# Patient Record
Sex: Male | Born: 1975 | Race: Black or African American | Hispanic: No | Marital: Single | Smoking: Current every day smoker
Health system: Southern US, Community
[De-identification: ages and names within clinical notes are randomized; demographics above are authoritative.]

## PROBLEM LIST (undated history)

## (undated) DIAGNOSIS — E669 Obesity, unspecified: Secondary | ICD-10-CM

## (undated) DIAGNOSIS — I10 Essential (primary) hypertension: Secondary | ICD-10-CM

## (undated) DIAGNOSIS — I639 Cerebral infarction, unspecified: Secondary | ICD-10-CM

## (undated) HISTORY — PX: CHOLECYSTECTOMY: SHX55

## (undated) HISTORY — PX: APPENDECTOMY: SHX54

---

## 2003-03-17 ENCOUNTER — Emergency Department (HOSPITAL_COMMUNITY): Admission: EM | Admit: 2003-03-17 | Discharge: 2003-03-17 | Payer: Self-pay | Admitting: Emergency Medicine

## 2004-04-09 ENCOUNTER — Emergency Department (HOSPITAL_COMMUNITY): Admission: EM | Admit: 2004-04-09 | Discharge: 2004-04-09 | Payer: Self-pay | Admitting: Emergency Medicine

## 2004-04-09 ENCOUNTER — Ambulatory Visit (HOSPITAL_COMMUNITY): Admission: RE | Admit: 2004-04-09 | Discharge: 2004-04-09 | Payer: Self-pay | Admitting: Neurology

## 2004-05-04 ENCOUNTER — Ambulatory Visit (HOSPITAL_COMMUNITY): Admission: RE | Admit: 2004-05-04 | Discharge: 2004-05-04 | Payer: Self-pay | Admitting: Neurology

## 2005-05-18 ENCOUNTER — Encounter (INDEPENDENT_AMBULATORY_CARE_PROVIDER_SITE_OTHER): Payer: Self-pay | Admitting: Specialist

## 2005-05-18 ENCOUNTER — Inpatient Hospital Stay (HOSPITAL_COMMUNITY): Admission: EM | Admit: 2005-05-18 | Discharge: 2005-05-19 | Payer: Self-pay | Admitting: Emergency Medicine

## 2010-02-26 ENCOUNTER — Emergency Department (HOSPITAL_COMMUNITY): Admission: EM | Admit: 2010-02-26 | Discharge: 2010-02-26 | Payer: Self-pay | Admitting: Emergency Medicine

## 2010-03-02 ENCOUNTER — Emergency Department (HOSPITAL_COMMUNITY): Admission: EM | Admit: 2010-03-02 | Discharge: 2010-03-02 | Payer: Self-pay | Admitting: Emergency Medicine

## 2010-03-02 ENCOUNTER — Encounter (INDEPENDENT_AMBULATORY_CARE_PROVIDER_SITE_OTHER): Payer: Self-pay | Admitting: Internal Medicine

## 2010-03-02 DIAGNOSIS — I10 Essential (primary) hypertension: Secondary | ICD-10-CM | POA: Insufficient documentation

## 2010-03-02 DIAGNOSIS — K8020 Calculus of gallbladder without cholecystitis without obstruction: Secondary | ICD-10-CM | POA: Insufficient documentation

## 2010-03-02 DIAGNOSIS — Z8673 Personal history of transient ischemic attack (TIA), and cerebral infarction without residual deficits: Secondary | ICD-10-CM | POA: Insufficient documentation

## 2010-03-05 ENCOUNTER — Ambulatory Visit: Payer: Self-pay | Admitting: Infectious Diseases

## 2010-03-05 ENCOUNTER — Encounter: Payer: Self-pay | Admitting: Internal Medicine

## 2010-03-05 ENCOUNTER — Inpatient Hospital Stay (HOSPITAL_COMMUNITY): Admission: EM | Admit: 2010-03-05 | Discharge: 2010-03-07 | Payer: Self-pay | Admitting: Emergency Medicine

## 2010-03-06 ENCOUNTER — Encounter: Payer: Self-pay | Admitting: Internal Medicine

## 2010-03-07 ENCOUNTER — Encounter: Payer: Self-pay | Admitting: Internal Medicine

## 2010-12-24 ENCOUNTER — Encounter: Payer: Self-pay | Admitting: Neurology

## 2011-01-02 NOTE — Miscellaneous (Signed)
Summary: hospital admission (HTN urgency)   INTERNAL MEDICINE ADMISSION HISTORY AND PHYSICAL  PCP: None  ATTENDING: Dr. Tilford Pillar 1st contact: Dr. Scot Dock: [phone] 2nd contact: Dr. Comer Locket: [phone] Nights and weekends: [phone], [phone]  CC: chest pain  HPI:  35 y/o man with PMh of HTN and ischemic stroke in 2005 comes to the Ed complainig of abdominal pain. The pain is diffuse, more on the RUQ, sharp in quality, not radiating to back or anywhere. Started today morning. Aggravated by food, not releived by anything. He had similar kind of pain before as well when he came to the ED on 03/02/2010 at which time abdominal USG showed gallstones. He did not have any evidence of cholecystits with outpatient follow up with the genereal surgeon. He had been having episodic abd pain similar to what he has today releived with percocet.  He denies fever, nausea, vomiting, chest pain or SOB.  He has not seen Gen Surgeon yet as he did not have any primary insurance.   ALLERGIES: NKDA  PAST MEDICAL HISTORY: Essential HTN.  Ischemic stroke in 2005. No residual deficits.  Gallstones seen on USG (02/27/2010) when he was in the ED for abd pain- No evidence of cholecystits. scheduled for outaptient visit with gen surgeon  MEDICATIONS:  LISINOPRIL-HYDROCHLOROTHIAZIDE 10-12.5 MG TABS (LISINOPRIL-HYDROCHLOROTHIAZIDE) take 1 tablet daily ASPIR-LOW 81 MG TBEC (ASPIRIN) take 1 tablet daily PERCOCET 5-325 MG TABS (OXYCODONE-ACETAMINOPHEN) 1-2 tabs every 6 hours as needed for pain    SOCIAL HISTORY: Lives with family . Used to work in [employer], has been [employer] since last few months. smoking, denies Alcahol and illicit drug use.   FAMILY HISTORY No significant family history of CAD, malignancy or any other medical probs in family he is aware of.   VITALS: T: 97.9  P: 97  BP: 244/144  R:17/min  O2SAT: 99%  ON: RA  PHYSICAL EXAM: Gen: awake, NAd HEENT: PERRLA, EOMI, mucous memb moist  and pink CVS: s1 s2 normal, no murmurs Respi: CTA b/l, no wheezes, crackles.  Abd: soft, ttp on RUQ and LUQ. also gaurding. No rigidity,No CVA tender. Normal  bs b/l.  Neuro: Oriented X3, cranial 2-12 intact, normal sensation, motor strength, reflexes 2+b/l Extremity: no edema, no calf tenderness.  LABS:   Sodium (NA)                              138               135-145          mEq/L  Potassium (K)                            4.6               3.5-5.1          mEq/L  Chloride                                 107               96-112           mEq/L  Glucose                                  136  h      70-99            mg/dL  BUN                                      12                6-23             mg/dL  Creatinine                               1.1               0.4-1.5          mg/dL  Bilirubin, Total                         0.8               0.3-1.2          mg/dL  Bilirubin, Direct                        0.3               0.0-0.3          mg/dL  Indirect Bilirubin                       0.5               0.3-0.9          mg/dL  Alkaline Phosphatase                     51                39-117           U/L  SGOT (AST)                               33                0-37             U/L  SGPT (ALT)                               33                0-53             U/L  Total  Protein                           7.6               6.0-8.3          g/dL  Albumin-Blood                            4.0               3.5-5.2          g/dL   WBC  5.5               4.0-10.5         K/uL  RBC                                      4.89              4.22-5.81        MIL/uL  Hemoglobin (HGB)                         15.3              13.0-17.0        g/dL  Hematocrit (HCT)                         44.8              39.0-52.0        %  MCV                                      91.6              78.0-100.0       fL  MCHC                                     34.1               30.0-36.0        g/dL  RDW                                      13.1              11.5-15.5        %  Platelet Count (PLT)                     200               150-400          K/uL  Neutrophils, %                           55                43-77            %  Lymphocytes, %                           32                12-46            %  Monocytes, %                             8                 3-12             %  Eosinophils, %                           5                 0-5              %  Basophils, %                             1                 0-1              %  Neutrophils, Absolute                    3.0               1.7-7.7          K/uL  Lymphocytes, Absolute                    1.7               0.7-4.0          K/uL  Monocytes, Absolute                      0.4               0.1-1.0          K/uL  Eosinophils, Absolute                    0.2               0.0-0.7          K/uL  Basophils, Absolute                      0.0               0.0-0.1          K/uL   Lipase-19   UA:  Color, Urine                             YELLOW            YELLOW  Appearance                               CLEAR             CLEAR  Specific Gravity                         1.016             1.005-1.030  pH                                       7.0               5.0-8.0  Urine Glucose                            NEGATIVE  NEG              mg/dL  Bilirubin                                NEGATIVE          NEG  Ketones                                  NEGATIVE          NEG              mg/dL  Blood                                    NEGATIVE          NEG  Protein                                  NEGATIVE          NEG              mg/dL  Urobilinogen                             0.2               0.0-1.0          mg/dL  Nitrite                                  NEGATIVE          NEG  Leukocytes                               NEGATIVE          NEG    MICROSCOPIC NOT DONE ON URINES WITH  NEGATIVE PROTEIN, BLOOD, LEUKOCYTES,    NITRITE, OR GLUCOSE <1000 mg/dL.  USG (3/31)   1.  Slight decrease and gallbladder wall thickness, now measuring   4.0 mm.  There is still appears be some gallbladder wall edema.   2.  Stable cholelithiasis.  Although there is no sonographic   Murphy's sign, cholecystitis is not excluded.   3.  Probable diffuse fatty infiltration of the liver.  ED Treatmenht:  - ASA 325 poX1 - Dialudid 1mg  iv X2 - zofran 4mg  iv X1 - esmolol 50 micrograms/min  ASSESSMENT AND PLAN:  1. HTN urgency- no signs of EOD. No CP,. SOB, renal insufficiency or AMS. Will check cycle cardiac enz and check 2D echo to assess for cardiac function. When he was in the ED on 03/02/2010, he had simialr blood preassure which was controlled once he was pain free with morphine. He was discharged on lisinopirl and HCTZ. This might be the case as well but given he had a stroke at the age of 85 is also concerning. Will check for secondary cause of hypertension like thyroid, UDS. Will check FLP, HbA1C. Will start labetolol drip and keep on HCTZ-lisinopril. Will not drop BP <180 to maintain perfusion. will control pain with morphine.  2. Abd pain- suspect  2/2 gallstones althought the pain is much more than would expect from stable gallstone. Will get contrast CT abd/pelvis. Will consult surgery tomorrow.  3. CVA- stable. No change  4. VTE PROPH: lovenox

## 2011-01-02 NOTE — Miscellaneous (Signed)
Summary: ED d/c  Hospital Discharge  Date of admission: Not admitted, seen in ED 03/02/10  Date of discharge:  Brief reason for admission/active problems: Abd pain 2/2 gallstones, no fevers, pain resolved. Exam benign. Seen by surgeon on 3/27 who would like to see him as an outpt.   Followup needed: He will see Dr. Threasa Beards on April 7 @ 3:30 pm. He will need follow-up of his HTN. He was started on Lisinopril-HCTZ combo pill on 3/27. He will need eval of his BP and a Bmet at his follow-up appt. It is crucial that his blood pressure be better controlled as he already has a h/o CVA. Please also ensure that the patient has called to schedule his appt with the surgeon to possibly have his gallbladder removed.   The medication and problem lists have been updated.  Please see the dictated discharge summary for details.   Clinical Lists Changes  Problems: Added new problem of CHOLELITHIASIS (ICD-574.20) Added new problem of CVA (ICD-434.91) Added new problem of ESSENTIAL HYPERTENSION (ICD-401.9) Medications: Added new medication of LISINOPRIL-HYDROCHLOROTHIAZIDE 10-12.5 MG TABS (LISINOPRIL-HYDROCHLOROTHIAZIDE) take 1 tablet daily Added new medication of ASPIR-LOW 81 MG TBEC (ASPIRIN) take 1 tablet daily Added new medication of PERCOCET 5-325 MG TABS (OXYCODONE-ACETAMINOPHEN) 1-2 tabs every 6 hours as needed for pain  Appended Document: ED d/c    Clinical Lists Changes  Observations: Added new observation of INSTRUCTIONS: You have an appointment at the [facility] on April 7 @ 3:30 pm. The clinic is located in the [employer] basement of [facility]. Please schedule an appointment with the surgeon as soon as possible.  If you have any questions or concerns, please call the clinic at [phone].   (03/02/2010 16:25)       Complete Medication List: 1)  Lisinopril-hydrochlorothiazide 10-12.5 Mg Tabs (Lisinopril-hydrochlorothiazide) .... Take 1 tablet daily 2)   Aspir-low 81 Mg Tbec (Aspirin) .... Take 1 tablet daily 3)  Percocet 5-325 Mg Tabs (Oxycodone-acetaminophen) .Marland Kitchen.. 1-2 tabs every 6 hours as needed for pain    Patient Instructions: 1)  You have an appointment at the [facility] on April 7 @ 3:30 pm. The clinic is located in the [employer] basement of [facility]. 2)  Please schedule an appointment with the surgeon as soon as possible.  3)  If you have any questions or concerns, please call the clinic at [phone].

## 2011-01-02 NOTE — Miscellaneous (Signed)
Summary: hospital dicharge (acute cholecystitis)  Hospital Discharge  Date of admission: 03/05/2010  Date of discharge: 03/07/2010  Brief reason for admission/active problems: acute cholecystitis,  HTN urgency  Followup needed: - BP check and adjust meds as necessary - added pravastatin in the hospital- patient not on statin after the stroke - post lap cholecystectomy follow up.    The medication and problem lists have been updated.  Please see the dictated discharge summary for details.   Prescriptions: PRAVASTATIN SODIUM 20 MG TABS (PRAVASTATIN SODIUM) Take 1 tablet by mouth once a day  #30 x 1   Entered and Authorized by:   Bethel Born MD   Signed by:   Bethel Born MD on 03/07/2010   Method used:   Print then Give to Patient   RxID:   [id number]    Patient Instructions: 1)  Your next appointment with [facility] is on April 21st at 1:45 pm 2)  Your appointment with surgeon is on 3)  Continue with incesntive spirometry at home 4)  Call the clinic or come to the ED if you develop fever or belly pain

## 2011-02-21 LAB — POCT CARDIAC MARKERS: Troponin i, poc: <0.05 ng/mL (ref 0.00–0.09)

## 2011-02-21 LAB — URINALYSIS, ROUTINE W REFLEX MICROSCOPIC
Bilirubin Urine: NEGATIVE
Glucose, UA: NEGATIVE mg/dL
Hgb urine dipstick: NEGATIVE
Ketones, ur: NEGATIVE mg/dL
Nitrite: NEGATIVE
Specific Gravity, Urine: 1.016 (ref 1.005–1.030)
Urobilinogen, UA: 0.2 mg/dL (ref 0.0–1.0)

## 2011-02-21 LAB — BASIC METABOLIC PANEL
BUN: 9 mg/dL (ref 6–23)
Chloride: 102 mEq/L (ref 96–112)
GFR calc Af Amer: >60 mL/min (ref >60)
Potassium: 3.6 mEq/L (ref 3.5–5.1)

## 2011-02-21 LAB — LIPID PANEL
HDL: 30 mg/dL — ABNORMAL LOW (ref >39)
LDL Cholesterol: 149 mg/dL — ABNORMAL HIGH (ref 0–99)
Total CHOL/HDL Ratio: 6.5 RATIO
VLDL: 16 mg/dL (ref 0–40)

## 2011-02-21 LAB — CBC
HCT: 37.8 % — ABNORMAL LOW (ref 39.0–52.0)
HCT: 44.8 % (ref 39.0–52.0)
Hemoglobin: 13.8 g/dL (ref 13.0–17.0)
Hemoglobin: 15.3 g/dL (ref 13.0–17.0)
MCHC: 34 g/dL (ref 30.0–36.0)
MCHC: 34.1 g/dL (ref 30.0–36.0)
MCV: 91.4 fL (ref 78.0–100.0)
MCV: 91.6 fL (ref 78.0–100.0)
Platelets: 175 10*3/uL (ref 150–400)
RBC: 4.13 MIL/uL — ABNORMAL LOW (ref 4.22–5.81)
RBC: 4.89 MIL/uL (ref 4.22–5.81)
RDW: 13.5 % (ref 11.5–15.5)
WBC: 10.7 10*3/uL — ABNORMAL HIGH (ref 4.0–10.5)
WBC: 5.5 10*3/uL (ref 4.0–10.5)

## 2011-02-21 LAB — CARDIAC PANEL(CRET KIN+CKTOT+MB+TROPI)
CK, MB: 2 ng/mL (ref 0.3–4.0)
Total CK: 199 U/L (ref 7–232)
Troponin I: 0.02 ng/mL (ref 0.00–0.06)

## 2011-02-21 LAB — HEPATIC FUNCTION PANEL
ALT: 33 U/L (ref 0–53)
AST: 33 U/L (ref 0–37)
Total Protein: 7.6 g/dL (ref 6.0–8.3)

## 2011-02-21 LAB — DIFFERENTIAL
Eosinophils Absolute: 0.2 10*3/uL (ref 0.0–0.7)
Eosinophils Relative: 5 % (ref 0–5)
Lymphocytes Relative: 32 % (ref 12–46)
Lymphs Abs: 1.7 10*3/uL (ref 0.7–4.0)
Monocytes Relative: 8 % (ref 3–12)

## 2011-02-21 LAB — GLUCOSE, CAPILLARY
Glucose-Capillary: 117 mg/dL — ABNORMAL HIGH (ref 70–99)
Glucose-Capillary: 162 mg/dL — ABNORMAL HIGH (ref 70–99)

## 2011-02-21 LAB — HIV ANTIBODY (ROUTINE TESTING W REFLEX): HIV: NONREACTIVE

## 2011-02-21 LAB — COMPREHENSIVE METABOLIC PANEL
ALT: 28 U/L (ref 0–53)
Calcium: 8.6 mg/dL (ref 8.4–10.5)
GFR calc Af Amer: >60 mL/min (ref >60)
Glucose, Bld: 117 mg/dL — ABNORMAL HIGH (ref 70–99)
Sodium: 135 mEq/L (ref 135–145)
Total Protein: 6.7 g/dL (ref 6.0–8.3)

## 2011-02-21 LAB — POCT I-STAT, CHEM 8
BUN: 12 mg/dL (ref 6–23)
Creatinine, Ser: 1.1 mg/dL (ref 0.4–1.5)
Glucose, Bld: 136 mg/dL — ABNORMAL HIGH (ref 70–99)
HCT: 47 % (ref 39.0–52.0)
TCO2: 27 mmol/L (ref 0–100)

## 2011-02-21 LAB — AMYLASE: Amylase: 69 U/L (ref 0–105)

## 2011-02-21 LAB — PROTIME-INR
INR: 1.02 (ref 0.00–1.49)
Prothrombin Time: 13.3 seconds (ref 11.6–15.2)

## 2011-02-21 LAB — APTT: aPTT: 32 seconds (ref 24–37)

## 2011-02-21 LAB — TSH: TSH: 0.589 u[IU]/mL (ref 0.350–4.500)

## 2011-02-21 LAB — MRSA PCR SCREENING: MRSA by PCR: NEGATIVE

## 2011-02-25 LAB — URINALYSIS, ROUTINE W REFLEX MICROSCOPIC
Glucose, UA: NEGATIVE mg/dL
Hgb urine dipstick: NEGATIVE
Specific Gravity, Urine: 1.009 (ref 1.005–1.030)
Urobilinogen, UA: 0.2 mg/dL (ref 0.0–1.0)

## 2011-02-25 LAB — DIFFERENTIAL
Basophils Absolute: 0 10*3/uL (ref 0.0–0.1)
Eosinophils Absolute: 0.3 10*3/uL (ref 0.0–0.7)
Eosinophils Relative: 6 % — ABNORMAL HIGH (ref 0–5)
Lymphocytes Relative: 27 % (ref 12–46)
Lymphocytes Relative: 30 % (ref 12–46)
Lymphs Abs: 1.5 10*3/uL (ref 0.7–4.0)
Monocytes Absolute: 0.4 10*3/uL (ref 0.1–1.0)
Monocytes Absolute: 0.5 10*3/uL (ref 0.1–1.0)
Monocytes Relative: 8 % (ref 3–12)
Monocytes Relative: 9 % (ref 3–12)
Neutro Abs: 4.1 10*3/uL (ref 1.7–7.7)

## 2011-02-25 LAB — POCT I-STAT, CHEM 8
Calcium, Ion: 1.23 mmol/L (ref 1.12–1.32)
Creatinine, Ser: 1.2 mg/dL (ref 0.4–1.5)
Glucose, Bld: 111 mg/dL — ABNORMAL HIGH (ref 70–99)
HCT: 49 % (ref 39.0–52.0)
Hemoglobin: 16.7 g/dL (ref 13.0–17.0)

## 2011-02-25 LAB — CBC
HCT: 46 % (ref 39.0–52.0)
HCT: 46.2 % (ref 39.0–52.0)
Hemoglobin: 16 g/dL (ref 13.0–17.0)
MCHC: 33.7 g/dL (ref 30.0–36.0)
MCV: 90.8 fL (ref 78.0–100.0)
MCV: 92.6 fL (ref 78.0–100.0)
Platelets: 188 10*3/uL (ref 150–400)
Platelets: 190 10*3/uL (ref 150–400)
RBC: 5.09 MIL/uL (ref 4.22–5.81)
RDW: 13.3 % (ref 11.5–15.5)
WBC: 4.9 10*3/uL (ref 4.0–10.5)

## 2011-02-25 LAB — POCT CARDIAC MARKERS: Myoglobin, poc: 109 ng/mL (ref 12–200)

## 2011-02-25 LAB — HEPATIC FUNCTION PANEL
ALT: 32 U/L (ref 0–53)
Alkaline Phosphatase: 56 U/L (ref 39–117)
Bilirubin, Direct: <0.1 mg/dL (ref 0.0–0.3)
Total Bilirubin: 0.4 mg/dL (ref 0.3–1.2)

## 2011-02-25 LAB — COMPREHENSIVE METABOLIC PANEL
Albumin: 4.1 g/dL (ref 3.5–5.2)
BUN: 10 mg/dL (ref 6–23)
Creatinine, Ser: 0.96 mg/dL (ref 0.4–1.5)
Total Protein: 7.8 g/dL (ref 6.0–8.3)

## 2011-02-25 LAB — LIPASE, BLOOD: Lipase: 19 U/L (ref 11–59)

## 2011-04-20 NOTE — H&P (Signed)
NAMEPHINEAS, [name]                ACCOUNT NO.:  [id number]   MEDICAL RECORD NO.:  [id number]          PATIENT TYPE:  INP   LOCATION:  5705                         FACILITY:  [facility]   PHYSICIAN:  Gabrielle Dare. Janee Morn, M.D.DATE OF BIRTH:  1976-01-12   DATE OF ADMISSION:  05/17/2005  DATE OF DISCHARGE:                                HISTORY & PHYSICAL   CHIEF COMPLAINT:  Right lower quadrant abdominal pain.   HISTORY OF PRESENT ILLNESS:  Patient is a 35 year old African-American male  who complains of right lower quadrant abdominal pain starting Tuesday.  He  had one episode of emesis today and the pain persisted and he came to [facility] Emergency Department for evaluation.  Work-up included white blood cell  count 16,000.  CT scan of the abdomen and pelvis was obtained and is  consistent with acute appendicitis.  He continues to have pain in his right  lower quadrant without radiation.   PAST MEDICAL HISTORY:  1.  Hypertension.  2.  CVA.  There was no deficits long-term from his stroke.   PAST SURGICAL HISTORY:  Negative.   ALLERGIES:  No known drug allergies.   MEDICATIONS:  Hydrochlorothiazide 50 mg p.o. daily.   REVIEW OF SYSTEMS:  CONSTITUTIONAL:  Negative.  CARDIAC:  Negative.  GI:  See above.  GU:  Negative.  NEUROLOGIC:  See above.  MUSCULOSKELETAL:  Negative.   PHYSICAL EXAMINATION:  VITAL SIGNS:  Temperature 98.9, blood pressure  177/91, respirations 20, pulse 93.  GENERAL:  He is awake and alert and in no acute distress.  NECK:  Supple without tenderness.  LUNGS:  Clear to auscultation.  Respiratory excursion is normal.  HEART:  Regular rate and rhythm.  ABDOMEN:  Soft.  He has tenderness in the right lower quadrant with  voluntary guarding.  He has a positive Rosvig's sign.  Bowel sounds are  hypoactive.  No organomegaly is palpated.  SKIN:  Warm and dry without rash.  MUSCULOSKELETAL:  No focal weakness.   DATA REVIEWED:  CT scan with findings as above.   LABORATORIES:  White blood cell count 16,000, hemoglobin 15.3.  Sodium 135,  potassium 4.2, chloride 102, BUN 10, creatinine 1.4.  Liver function tests  are within normal limits.   IMPRESSION:  A 35 year old African-American male with acute appendicitis.   PLAN:  Take him to the operating room for a laparoscopic appendectomy.  The  procedure, risks, and benefits were discussed in detail with the patient.  Questions were answered.  We will give him some IV antibiotics preoperative.       BET/MEDQ  D:  05/18/2005  T:  05/18/2005  Job:  [id number]

## 2011-04-20 NOTE — Op Note (Signed)
NAME:  [name], [name] NO.:  [id number]   MEDICAL RECORD NO.:  [id number]          PATIENT TYPE:  INP   LOCATION:  1825                         FACILITY:  [facility]   PHYSICIAN:  Gabrielle Dare. Janee Morn, M.D.DATE OF BIRTH:  [birth year]   DATE OF PROCEDURE:  05/18/2005  DATE OF DISCHARGE:                                 OPERATIVE REPORT   PREOPERATIVE DIAGNOSIS:  Acute appendicitis.   POSTOPERATIVE DIAGNOSIS:  Acute appendicitis with localized perforation.   OPERATION PERFORMED:  Laparoscopic appendectomy.   SURGEON:  Gabrielle Dare. Janee Morn, M.D.   ANESTHESIA:  General.   INDICATIONS FOR PROCEDURE:  The patient is a 35 year old African-American  male who developed right lower quadrant abdominal pain on Tuesday.  He  continued to have this pain at home with associated anorexia and emesis  times one for over 48 hours and he subsequently came to the [facility]  Emergency Department for evaluation.  He was found to have an elevated white  blood cell count about 16,000 and CT scan of the abdomen and pelvis was  consistent with acute appendicitis.  He was brought to the operating room  for emergent appendectomy.  He received intravenous antibiotics on call to  the OR.   DESCRIPTION OF PROCEDURE:  Informed consent was obtained.  The patient was  identified and brought to the operating room and general anesthesia was  administered.  His abdomen was prepped and draped in sterile fashion.  An  infraumbilical incision was made.  Subcutaneous tissues were dissected down  revealing the anterior fascia which was divided sharply.  The peritoneal  cavity was then entered under direct vision without difficulty.  A 0 Vicryl  pursestring suture was placed around the fascial opening and the Hasson  trocar was inserted into the abdomen and the abdomen was insufflated with  carbon dioxide in standard fashion.  Under direct vision, a 12 mm left lower  quadrant, then a 5 mm right upper quadrant  port were placed.  0.25% Marcaine  with epinephrine was used at all port sites.  Exploration of the abdomen  revealed a very inflamed and locally perforated appendix with approximately  3 cc of purulent exudate covering it in the lateral peritoneum.  The  appendix was grasped and the base was intact.  It was dissected from the  mesoappendix and the base was divided with an endoscopic GIA stapler with  the vascular load.  The mesoappendix was then divided with an endoscopic GIA  stapler with a vascular load.  The appendix was placed in an EndoCatch bag  and taken out of the abdomen via the left lower quadrant port site.  The  abdomen was then copiously irrigated with several liters of saline.  All of  the purulent exudate was evacuated.  The staple line was checked for  hemostasis and one small area was cauterized to get complete hemostasis.  Once that was accomplished, the abdomen was further irrigated.  The  irrigation fluid returned clear and the remainder of it was evacuated.  Staple lines were rechecked and hemostasis was present and they were nicely  intact.  The ports were removed under direct vision.  The pneumoperitoneum  was released.  The Hasson trocar was removed from the abdomen.  The upper  umbilical fascia was closed by tying the 0 Vicryl pursestring suture.  All  three wounds were copiously irrigated and  the skin of each was closed with a running 4-0 Vicryl subcuticular.  Sponge,  needle and instrument counts were all correct.  Benzoin and Steri-Strips and  a sterile dressing were applied.  The patient tolerated the procedure well  without apparent complication and was taken to the recovery room in stable  condition.       BET/MEDQ  D:  05/18/2005  T:  05/18/2005  Job:  [id number]

## 2011-04-20 NOTE — Discharge Summary (Signed)
[name], [name] NO.:  [id number]   MEDICAL RECORD NO.:  [id number]          PATIENT TYPE:  INP   LOCATION:  5705                         FACILITY:  [facility]   PHYSICIAN:  Gita Kudo, M.D. DATE OF BIRTH:  August 02, 1976   DATE OF ADMISSION:  05/17/2005  DATE OF DISCHARGE:  05/19/2005                                 DISCHARGE SUMMARY   DISCHARGE DIAGNOSIS:  1.  Acute appendicitis with localized perforation status post microscopic      appendectomy May 18, 2005, by Dr. Violeta Gelinas.  2.  History of hypertension.  3.  History of cerebrovascular accident with no deficits.   HOSPITAL COURSE:  [name] is a 35 year old male patient who was admitted  on the early morning of May 18, 2005, with right lower quadrant abdominal  pain as well as nausea and vomiting.  He presented to the emergency room  where his white blood cell count was 16,000 and CT of the abdomen was  consistent with acute appendicitis.  The patient was taken emergently to the  operating room and was found to have acute appendicitis with localized  perforation.  He underwent a laparoscopic appendectomy without problems.  Postoperatively, he remained stable and the following day, he was ready for  discharge to home.  He was able to eat, void, and ambulate without  difficulty.  He is to return to work once released by Dr. Janee Morn.  He is  to call to be seen by Dr. Janee Morn within 2-3 weeks.  He may use Vicodin for  pain.  He may shower.  No driving for one week.      Guy Franco, P.A.    ______________________________  Gita Kudo, M.D.    LB/MEDQ  D:  07/20/2005  T:  07/20/2005  Job:  [id number]   cc:   Gabrielle Dare. Janee Morn, M.D.  [facility]  [address] [place], [place] [address]

## 2012-08-07 ENCOUNTER — Emergency Department (HOSPITAL_COMMUNITY): Payer: Medicaid Other

## 2012-08-07 ENCOUNTER — Inpatient Hospital Stay (HOSPITAL_COMMUNITY): Payer: Medicaid Other

## 2012-08-07 ENCOUNTER — Encounter (HOSPITAL_COMMUNITY): Payer: Self-pay | Admitting: Emergency Medicine

## 2012-08-07 ENCOUNTER — Observation Stay (HOSPITAL_COMMUNITY)
Admission: EM | Admit: 2012-08-07 | Discharge: 2012-08-08 | DRG: 684 | Discharge status: Against Medical Advice | Payer: Medicaid Other | Attending: Internal Medicine | Admitting: Internal Medicine

## 2012-08-07 DIAGNOSIS — Z9119 Patient's noncompliance with other medical treatment and regimen: Secondary | ICD-10-CM

## 2012-08-07 DIAGNOSIS — I6783 Posterior reversible encephalopathy syndrome: Secondary | ICD-10-CM | POA: Diagnosis present

## 2012-08-07 DIAGNOSIS — R6889 Other general symptoms and signs: Secondary | ICD-10-CM

## 2012-08-07 DIAGNOSIS — R209 Unspecified disturbances of skin sensation: Secondary | ICD-10-CM | POA: Insufficient documentation

## 2012-08-07 DIAGNOSIS — G459 Transient cerebral ischemic attack, unspecified: Secondary | ICD-10-CM

## 2012-08-07 DIAGNOSIS — Z91199 Patient's noncompliance with other medical treatment and regimen due to unspecified reason: Secondary | ICD-10-CM

## 2012-08-07 DIAGNOSIS — I119 Hypertensive heart disease without heart failure: Secondary | ICD-10-CM | POA: Diagnosis present

## 2012-08-07 DIAGNOSIS — I10 Essential (primary) hypertension: Principal | ICD-10-CM | POA: Diagnosis present

## 2012-08-07 DIAGNOSIS — Z720 Tobacco use: Secondary | ICD-10-CM | POA: Diagnosis present

## 2012-08-07 DIAGNOSIS — Z8673 Personal history of transient ischemic attack (TIA), and cerebral infarction without residual deficits: Secondary | ICD-10-CM

## 2012-08-07 DIAGNOSIS — I69959 Hemiplegia and hemiparesis following unspecified cerebrovascular disease affecting unspecified side: Secondary | ICD-10-CM | POA: Insufficient documentation

## 2012-08-07 DIAGNOSIS — H5340 Unspecified visual field defects: Secondary | ICD-10-CM | POA: Diagnosis present

## 2012-08-07 DIAGNOSIS — I161 Hypertensive emergency: Secondary | ICD-10-CM

## 2012-08-07 DIAGNOSIS — F17200 Nicotine dependence, unspecified, uncomplicated: Secondary | ICD-10-CM | POA: Insufficient documentation

## 2012-08-07 DIAGNOSIS — R51 Headache: Secondary | ICD-10-CM | POA: Insufficient documentation

## 2012-08-07 HISTORY — DX: Cerebral infarction, unspecified: I63.9

## 2012-08-07 HISTORY — DX: Essential (primary) hypertension: I10

## 2012-08-07 LAB — COMPREHENSIVE METABOLIC PANEL
ALT: 19 U/L (ref 0–53)
AST: 16 U/L (ref 0–37)
Albumin: 3.5 g/dL (ref 3.5–5.2)
CO2: 26 mEq/L (ref 19–32)
Calcium: 9 mg/dL (ref 8.4–10.5)
Sodium: 138 mEq/L (ref 135–145)
Total Protein: 6.8 g/dL (ref 6.0–8.3)

## 2012-08-07 LAB — PROTIME-INR: Prothrombin Time: 12.6 seconds (ref 11.6–15.2)

## 2012-08-07 LAB — POCT I-STAT, CHEM 8
HCT: 43 % (ref 39.0–52.0)
Hemoglobin: 14.6 g/dL (ref 13.0–17.0)
Potassium: 3.5 mEq/L (ref 3.5–5.1)
Sodium: 143 mEq/L (ref 135–145)
TCO2: 23 mmol/L (ref 0–100)

## 2012-08-07 LAB — DIFFERENTIAL
Basophils Absolute: 0 10*3/uL (ref 0.0–0.1)
Eosinophils Relative: 4 % (ref 0–5)
Lymphocytes Relative: 39 % (ref 12–46)

## 2012-08-07 LAB — TROPONIN I: Troponin I: <0.3 ng/mL (ref <0.30)

## 2012-08-07 LAB — URINALYSIS, ROUTINE W REFLEX MICROSCOPIC
Nitrite: NEGATIVE
Specific Gravity, Urine: 1.013 (ref 1.005–1.030)
Urobilinogen, UA: 0.2 mg/dL (ref 0.0–1.0)

## 2012-08-07 LAB — CBC
MCV: 87.6 fL (ref 78.0–100.0)
Platelets: 202 10*3/uL (ref 150–400)
RDW: 13.4 % (ref 11.5–15.5)
WBC: 8 10*3/uL (ref 4.0–10.5)

## 2012-08-07 LAB — CK TOTAL AND CKMB (NOT AT ARMC): Relative Index: 1.1 (ref 0.0–2.5)

## 2012-08-07 LAB — APTT: aPTT: 30 seconds (ref 24–37)

## 2012-08-07 MED ORDER — LABETALOL HCL 5 MG/ML IV SOLN
10.0000 mg | INTRAVENOUS | Status: DC | PRN
  Administered 2012-08-08 (×2): 10 mg via INTRAVENOUS
  Filled 2012-08-07 (×4): qty 4

## 2012-08-07 MED ORDER — LABETALOL HCL 5 MG/ML IV SOLN
20.0000 mg | Freq: Once | INTRAVENOUS | Status: AC
  Administered 2012-08-07: 20 mg via INTRAVENOUS
  Filled 2012-08-07: qty 4

## 2012-08-07 MED ORDER — ASPIRIN 325 MG PO TABS
325.0000 mg | ORAL_TABLET | Freq: Once | ORAL | Status: AC
  Administered 2012-08-07: 325 mg via ORAL
  Filled 2012-08-07: qty 1

## 2012-08-07 MED ORDER — MORPHINE SULFATE 4 MG/ML IJ SOLN
4.0000 mg | Freq: Once | INTRAMUSCULAR | Status: AC
  Administered 2012-08-07: 4 mg via INTRAVENOUS
  Filled 2012-08-07: qty 1

## 2012-08-07 MED ORDER — LORAZEPAM 2 MG/ML IJ SOLN
1.0000 mg | Freq: Once | INTRAMUSCULAR | Status: AC
  Administered 2012-08-07: 1 mg via INTRAVENOUS

## 2012-08-07 MED ORDER — ASPIRIN 325 MG PO TABS
325.0000 mg | ORAL_TABLET | Freq: Every day | ORAL | Status: DC
  Administered 2012-08-07 – 2012-08-08 (×2): 325 mg via ORAL
  Filled 2012-08-07 (×2): qty 1

## 2012-08-07 MED ORDER — SODIUM CHLORIDE 0.9 % IJ SOLN: 3.0000 mL | INTRAMUSCULAR | Status: DC | PRN

## 2012-08-07 MED ORDER — SODIUM CHLORIDE 0.9 % IV SOLN: 250.0000 mL | INTRAVENOUS | Status: DC | PRN

## 2012-08-07 MED ORDER — SODIUM CHLORIDE 0.9 % IJ SOLN
3.0000 mL | Freq: Two times a day (BID) | INTRAMUSCULAR | Status: DC
  Administered 2012-08-07 – 2012-08-08 (×3): 3 mL via INTRAVENOUS

## 2012-08-07 MED ORDER — ENOXAPARIN SODIUM 40 MG/0.4ML ~~LOC~~ SOLN
40.0000 mg | SUBCUTANEOUS | Status: DC
  Administered 2012-08-07 – 2012-08-08 (×2): 40 mg via SUBCUTANEOUS
  Filled 2012-08-07 (×3): qty 0.4

## 2012-08-07 NOTE — Progress Notes (Signed)
08/07/2012 5:31 PM [name]  [id number]  Report called to [name], RN on [address] VSS. Last bp =180/85. Transferred with belongings to 4 north room 19.   [name] [name] 9/5/20135:31 PM

## 2012-08-07 NOTE — ED Notes (Signed)
Pt alert, arrives from home, c/o headache, right side face numbness, onset last PM, resp even unlabored, speech clear, grips equal, ambulates to triage

## 2012-08-07 NOTE — H&P (Signed)
[facility] History and Physical  DORAL VENTRELLA ZOX:[id number] DOB: [birth year]    PCP:   No primary provider on file.   Chief Complaint: right sided headache, and right eye blurry.  HPI: [name] is an 36 y.o. 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  He wasn't able to see well, so he covered his left eye and figure out there were a left upper half visual field defect.  He denied slurred speech, upper or lower extremitiy weakness, but mild paresthesia over the left upper extremity.  No ataxia.  In the ER, he was found to have BP 230/120.  A head CT without constrast showed old pontine infarct but no acute process.  His Labs were rather unremarkable with Cr of 1.4. His EKG showed rhythm to be in sinus with TWI and high voltage c/w LVH.  He was given Labetelol IV and resulting BP of 177/100.  Hospitalist was asked to admit him for HTN emergency.  Rewiew of Systems:  Constitutional: Negative for malaise, fever and chills. No significant weight loss or weight gain Eyes: Negative for eye pain, redness and discharge, diplopia, or flashes of light. ENMT: Negative for ear pain, hoarseness, nasal congestion, sinus pressure and sore throat. No headaches; tinnitus, drooling, or problem swallowing. Cardiovascular: Negative for chest pain, palpitations, diaphoresis, dyspnea and peripheral edema. ; No orthopnea, PND Respiratory: Negative for cough, hemoptysis, wheezing and stridor. No pleuritic chestpain. Gastrointestinal: Negative for nausea, vomiting, diarrhea, constipation, abdominal pain, melena, blood in stool, hematemesis, jaundice and rectal bleeding.    Genitourinary: Negative for frequency, dysuria, incontinence,flank pain and hematuria; Musculoskeletal: Negative  for back pain and neck pain. Negative for swelling and trauma.;  Skin: . Negative for pruritus, rash, abrasions, bruising and skin lesion.; ulcerations Neuro: Negative for, lightheadedness and neck stiffness. Negative for weakness, altered level of consciousness , altered mental status, extremity weakness, burning feet, involuntary movement, seizure and syncope.  Psych: negative for anxiety, depression, insomnia, tearfulness, panic attacks, hallucinations, paranoia, suicidal or homicidal ideation    Past Medical History  Diagnosis Date  . Stroke   . Hypertension     History reviewed. No pertinent past surgical history.  Medications:  HOME MEDS: Prior to Admission medications   Not on File     Allergies:  No Known Allergies  Social History:   reports that he has been smoking Cigarettes.  He has been smoking about 1 pack per day. He does not have any smokeless tobacco history on file. He reports that he does not drink alcohol. His drug history not on file.  Family History: No family history on file.   Physical Exam: Filed Vitals:   08/07/12 0014 08/07/12 0152  BP: 223/113   Pulse: 97   Temp: 98 F (36.7 C) 98.3 F (36.8 C)  Resp: 16   Weight: 114.306 kg (252 lb)   SpO2: 99%    Blood pressure 223/113, pulse 97, temperature 98.3 F (36.8 C), resp. rate 16, weight 114.306 kg (252 lb), SpO2 99.00%.  GEN:  Pleasant patient lying in the stretcher in no acute distress; cooperative with exam. PSYCH:  alert and oriented x4; does not appear anxious or depressed; affect is appropriate. HEENT: Mucous membranes pink and anicteric; PERRLA; EOM intact; no  cervical lymphadenopathy nor thyromegaly or carotid bruit; no JVD; There were no stridor. Neck is very supple.  His funduscopic exam showed no paleness nor hyperemic retina, sharp disc margins, no hemorrhage (this was not a dilated eye exam.) Breasts:: Not examined CHEST WALL: No tenderness CHEST: Normal respiration, clear to  auscultation bilaterally.  HEART: Regular rate and rhythm.  There are no murmur, rub, or gallops.   BACK: No kyphosis or scoliosis; no CVA tenderness ABDOMEN: soft and non-tender; no masses, no organomegaly, normal abdominal bowel sounds; no pannus; no intertriginous candida. There is no rebound and no distention. Rectal Exam: Not done EXTREMITIES: No bone or joint deformity; age-appropriate arthropathy of the hands and knees; no edema; no ulcerations.  There is no calf tenderness. Genitalia: not examined PULSES: 2+ and symmetric SKIN: Normal hydration no rash or ulceration CNS: Cranial nerves 2-12 grossly intact no focal lateralizing neurologic deficit.  Speech is fluent; uvula elevated with phonation, facial symmetry and tongue midline. DTR are normal bilaterally, cerebella exam is intact, barbinski is negative and strengths are equaled bilaterally.  No sensory loss.  He has upper field cut in the right eye to visual field confrontation.   Labs on Admission:  Basic Metabolic Panel:  Lab 08/07/12 1610 08/07/12 0145  NA 143 138  K 3.5 3.3*  CL 105 104  CO2 -- 26  GLUCOSE 99 100*  BUN 12 12  CREATININE 1.40* 1.31  CALCIUM -- 9.0  MG -- --  PHOS -- --   Liver Function Tests:  Lab 08/07/12 0145  AST 16  ALT 19  ALKPHOS 48  BILITOT 0.2*  PROT 6.8  ALBUMIN 3.5   No results found for this basename: LIPASE:5,AMYLASE:5 in the last 168 hours No results found for this basename: AMMONIA:5 in the last 168 hours CBC:  Lab 08/07/12 0148 08/07/12 0145  WBC -- 8.0  NEUTROABS -- 4.0  HGB 14.6 13.9  HCT 43.0 40.8  MCV -- 87.6  PLT -- 202   Cardiac Enzymes:  Lab 08/07/12 0145  CKTOTAL 265*  CKMB 2.8  CKMBINDEX --  TROPONINI <0.30    CBG:  Lab 08/07/12 0213  GLUCAP 110*     Radiological Exams on Admission: Ct Head Wo Contrast  08/07/2012  *RADIOLOGY REPORT*  Clinical Data: Headaches with visual loss in the right eye and right facial numbness.  CT HEAD WITHOUT CONTRAST   Technique:  Contiguous axial images were obtained from the base of the skull through the vertex without contrast.  Comparison: MRI brain and head CT 04/09/2004.  Findings: There may be mild right-sided pontine atrophy corresponding with the previously demonstrated stroke.  There is no focal encephalomalacia. There is no evidence of acute intracranial hemorrhage, mass lesion, brain edema or extra-axial fluid collection.  The ventricles and subarachnoid spaces are appropriately sized for age.  There is no CT evidence of acute cortical infarction.  Intracranial vascular calcifications are noted.  The visualized paranasal sinuses are clear. The calvarium is intact. No orbital abnormalities are seen.  IMPRESSION: No acute intracranial findings.  No gross residual pontine abnormality.   Original Report Authenticated By: Gerrianne Scale, M.D.     EKG: Independently reviewed. As above.   Assessment/Plan Present on Admission:  .HTN (hypertension), malignant .Visual field cut .Tobacco abuse   PLAN:  HTN emergency with HA, visual field defect on the right eye.  Differential include PRES, complex migraine, HTN encephalopathy, TIA, CVA.  Less likely is retinal artery embolization.   I will transfer him  to [facility] SDU under [facility] service.  Will do stroke work up to include MRI, MRA brain, ECHO and coratid [place].  Will keep BP at 160 to 180 range for now.  Since his BP improving nicely, we'll hold off on Nitroprusside drip for now. ASA was given to him.  I have asked Dr Vonita Moss to see him tonight for any further recommendation.  I have asked him to quit cigarettes and admonished him for being non-compliant with his HTN meds.  Other plans as per orders.  Code Status: FULL.   Houston Siren, MD. [facility] Pager [phone] 7pm to 7am.  08/07/2012, 3:49 AM

## 2012-08-07 NOTE — Progress Notes (Signed)
[facility]  Pt admitted this AM with hypertensive urgency and right visual field defect. Pt examined and states he continues to see a dark spot in his right visual field- Headache has resolved.    Assessment/Plan  Hypertensive urgency  If he has had a CVA, it would be wise to allow for permissive hypertensive- will continue PRN Labetalol for now  Hypertensive heart disease ECHO reveals severe LVH, G 2 diastolic CHF and dilated atria- high risk for A-fib-  I have explained to the patient in detail the effects that his HTN is having on his heart- will need to initiate B Blockers in AM - for now cont IV Labetalol due to reason mentioned above.   Visual field defect - suspect retinal hemorrhage vs ischemia of retina vs CVA. F/u stroke work up as ordered by Neuro.  Opthalmology consult in  AM  Nicotine abuse Discussed smoking cessation  ARF Follow creatinine in AM   Brandon Cantor, MD [phone]

## 2012-08-07 NOTE — ED Notes (Signed)
Hospitalist at bedside 

## 2012-08-07 NOTE — ED Provider Notes (Signed)
History     CSN: [id number]  Arrival date & time 08/07/12  0002   First MD Initiated Contact with Patient 08/07/12 0030      Chief Complaint  Patient presents with  . Headache  . Visual Field Change    (Consider location/radiation/quality/duration/timing/severity/associated sxs/prior treatment) HPI 36 year old male presents from home to the emergency department complaining of headache, right-sided facial weakness, and change in vision in his right eye. Patient reports he developed a headache on Monday, 2 days ago. He noticed Tuesday evening while watching TV that he was unable to see the upper half of his vision in his right eye. Patient reports things are a dull red color in this field. He noticed tonight when his headache worsened at the right side of his face felt different when touching it and looked different when he was looking in the mirror. Patient with prior history of CVA 9 years ago he denies any residual deficits from the stroke. Patient has history of hypertension, ran out of his hydrochlorothiazide about one year ago. Patient feels like his blood pressure is worse now than it normally is. He denies any chest pain, no shortness of breath, no abdominal pain. He has not see a Dr. in regular basis.  Past Medical History  Diagnosis Date  . Stroke   . Hypertension     History reviewed. No pertinent past surgical history.  No family history on file.  History  Substance Use Topics  . Smoking status: Current Everyday Smoker -- 1.0 packs/day    Types: Cigarettes  . Smokeless tobacco: Not on file  . Alcohol Use: No      Review of Systems  All other systems reviewed and are negative.    Allergies  Review of patient's allergies indicates no known allergies.  Home Medications  No current outpatient prescriptions on file.  BP 223/113  Pulse 97  Temp 98.3 F (36.8 C)  Resp 16  Wt 252 lb (114.306 kg)  SpO2 99%  Physical Exam  Nursing note and vitals  reviewed. Constitutional: He is oriented to person, place, and time. He appears well-developed and well-nourished.  HENT:  Head: Normocephalic and atraumatic.  Nose: Nose normal.  Mouth/Throat: Oropharynx is clear and moist.       Very poor dentition  Eyes: Conjunctivae and EOM are normal. Pupils are equal, round, and reactive to light.  Fundoscopic exam:      The right eye shows no arteriolar narrowing, no AV nicking, no exudate, no hemorrhage and no papilledema.       The left eye shows no arteriolar narrowing, no AV nicking, no exudate, no hemorrhage and no papilledema.  Neck: Normal range of motion. Neck supple. No JVD present. No tracheal deviation present. No thyromegaly present.  Cardiovascular: Normal rate, regular rhythm, normal heart sounds and intact distal pulses.  Exam reveals no gallop and no friction rub.   No murmur heard. Pulmonary/Chest: Effort normal and breath sounds normal. No stridor. No respiratory distress. He has no wheezes. He has no rales. He exhibits no tenderness.  Abdominal: Soft. Bowel sounds are normal. He exhibits no distension and no mass. There is no tenderness. There is no rebound and no guarding.  Musculoskeletal: Normal range of motion. He exhibits no edema and no tenderness.  Lymphadenopathy:    He has no cervical adenopathy.  Neurological: He is alert and oriented to person, place, and time. He has normal reflexes. A cranial nerve deficit (reported decreased sensation to right face upper and  lower, nasolabial fold difference from right to left, decreased smile on right) is present. He exhibits normal muscle tone. Coordination normal.  Skin: Skin is warm and dry. No rash noted. No erythema. No pallor.  Psychiatric: He has a normal mood and affect. His behavior is normal. Judgment and thought content normal.    ED Course  Procedures (including critical care time)  Labs Reviewed  COMPREHENSIVE METABOLIC PANEL - Abnormal; Notable for the following:     Potassium 3.3 (*)     Glucose, Bld 100 (*)     Total Bilirubin 0.2 (*)     GFR calc non Af Amer 69 (*)     GFR calc Af Amer 80 (*)     All other components within normal limits  CK TOTAL AND CKMB - Abnormal; Notable for the following:    Total CK 265 (*)     All other components within normal limits  POCT I-STAT, CHEM 8 - Abnormal; Notable for the following:    Creatinine, Ser 1.40 (*)     All other components within normal limits  GLUCOSE, CAPILLARY - Abnormal; Notable for the following:    Glucose-Capillary 110 (*)     All other components within normal limits  CBC  DIFFERENTIAL  TROPONIN I  PROTIME-INR  APTT  URINALYSIS, ROUTINE W REFLEX MICROSCOPIC   Ct Head Wo Contrast  08/07/2012  *RADIOLOGY REPORT*  Clinical Data: Headaches with visual loss in the right eye and right facial numbness.  CT HEAD WITHOUT CONTRAST  Technique:  Contiguous axial images were obtained from the base of the skull through the vertex without contrast.  Comparison: MRI brain and head CT 04/09/2004.  Findings: There may be mild right-sided pontine atrophy corresponding with the previously demonstrated stroke.  There is no focal encephalomalacia. There is no evidence of acute intracranial hemorrhage, mass lesion, brain edema or extra-axial fluid collection.  The ventricles and subarachnoid spaces are appropriately sized for age.  There is no CT evidence of acute cortical infarction.  Intracranial vascular calcifications are noted.  The visualized paranasal sinuses are clear. The calvarium is intact. No orbital abnormalities are seen.  IMPRESSION: No acute intracranial findings.  No gross residual pontine abnormality.   Original Report Authenticated By: Gerrianne Scale, M.D.     Date: 08/07/2012  Rate: 80  Rhythm: normal sinus rhythm  QRS Axis: normal  Intervals: normal  ST/T Wave abnormalities: early repolarization  Conduction Disutrbances:none  Narrative Interpretation: LAH, LVH, t wave inversion unchanged  from prior  Old EKG Reviewed: unchanged    1. Hypertensive emergency   2. Noncompliance   3. Tobacco abuse   4. Visual field cut       MDM  Male with hypertension, now with visual field defect and right-sided facial numbness and deficit. Differential includes hypertensive encephalopathy, press syndrome, hypertensive urgency, CVA ossicles would admit to the hospital. We'll give labetalol and pain medicines here. CT scan without acute changes, patient not TPA candidate given length of symptoms and hypertension.        Olivia Mackie, MD 08/07/12 [id number]

## 2012-08-07 NOTE — Progress Notes (Signed)
VASCULAR LAB PRELIMINARY  PRELIMINARY  PRELIMINARY  PRELIMINARY  Carotid Dopplers completed.    Preliminary report:  No ICA stenosis.  Vertebral artery flow is antegrade.  [name], 08/07/2012, 3:23 PM

## 2012-08-07 NOTE — Progress Notes (Signed)
Echocardiogram 2D Echocardiogram has been performed.  Joshau Code, [facility] 08/07/2012, 10:48 AM

## 2012-08-07 NOTE — Progress Notes (Signed)
08/07/2012 0650  Pt admitted to [facility] from [facility] ED via [facility] with belongings. VSS. Family aware. Elink aware.  Will continue to monitor.  [place] Jeovanny Cuadros, RN

## 2012-08-07 NOTE — Consult Note (Signed)
Reason for Consult: Vision change Referring Physician: Houston Siren  CC: Vision change  History is obtained from: Patient, companion  HPI: [name] is an 36 y.o. male with a history of previous stroke causing left-sided hemiparesis who presents with several days of right-sided head "fullness". Then more recently, he noticed that he had visual changes in his right eye. If he closes his right eye his vision is appears normal, he closes his left eye than everything above the middle is "blurry". His symptoms have been static since onset. When he went to the [facility] long emergency room, his blood pressure was 230/120.   ROS: An 11 point ROS was performed and is negative except as noted in the HPI.  Past Medical History  Diagnosis Date  . Stroke   . 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  Exam: Current vital signs: BP 162/86  Pulse 77  Temp 97.7 F (36.5 C) (Oral)  Resp 19  Ht 5\' 8"  (1.727 m)  Wt 114.5 kg (252 lb 6.8 oz)  BMI 38.38 kg/m2  SpO2 100% Vital signs in last 24 hours: Temp:  [97.7 F (36.5 C)-98.3 F (36.8 C)] 97.7 F (36.5 C) (09/05 0800) Pulse Rate:  [72-97] 77  (09/05 0800) Resp:  [16-25] 19  (09/05 0800) BP: (161-223)/(86-122) 162/86 mmHg (09/05 0800) SpO2:  [96 %-100 %] 100 % (09/05 0800) Weight:  [114.306 kg (252 lb)-114.5 kg (252 lb 6.8 oz)] 114.5 kg (252 lb 6.8 oz) (09/05 0646)  General: In bed, no apparent distress CV: Regular rate and rhythm Mental Status: Patient is awake, alert, oriented to person, place, month, year, and situation. Immediate and remote memory are intact. Patient is able to give a clear and coherent history. Cranial Nerves: II: Visual Fields are full. Pupils are equal, round, and reactive to light.  Discs are difficult to visualize. III,IV, VI: EOMI without ptosis or diploplia.  V,VII: Facial movement is symmetric. Decreased to temperature  sensation in the right V2 distribution. VIII: hearing is intact to voice X: Uvula elevates symmetrically XI: Shoulder shrug is symmetric. XII: tongue is midline without atrophy or fasciculations.  Motor: Tone is normal. Bulk is normal. 5/5 strength was present in all four extremities. No drift Sensory: Sensation is symmetric to light touch and temperature in the arms and legs. Deep Tendon Reflexes: 2+ and symmetric in the biceps and patellae.  Plantars: Toes are downgoing bilaterally.  Cerebellar: FNF is intact bilaterally Gait: Patient has a stable casual gait. He has difficulty with tandem  I have reviewed labs in epic and the results pertinent to this consultation are: Creatinine 1.4 No leukocytosis  I have reviewed the images obtained: CT head-no acute finding  Impression: 36 year-old male with previous CVA and new altitudinal defect of his right eye. This likely represents ocular pathology, either retinal branch artery occlusion or ischemic optic neuritis. It is possible that this represents an asymmetric field defect, and therefore an MRI brain is needed, especially in light of his facial sensation difference. This could be due to either pres, or ischemic infarct.  His head "fullness" could be concerning for dissection and therefore an MRA head and carotid Dopplers are needed.   Recommendations: 1. HgbA1c, fasting lipid panel 2. MRI, MRA  of the brain without contrast 3. Echocardiogram 4. Carotid dopplers 5. Prophylactic therapy-Antiplatelet med: Aspirin - dose 325 7. Telemetry monitoring  Ritta Slot, MD [facility] [phone]

## 2012-08-07 NOTE — Progress Notes (Signed)
Nurse Jeanice Lim gave report on pt. Pt placed on tele. Under no s/s distress currently.

## 2012-08-07 NOTE — ED Notes (Signed)
Nurse not available to take report. Will recall report soon. Contact # left for nurse to call back. 

## 2012-08-07 NOTE — Progress Notes (Signed)
Utilization review completed.  

## 2012-08-08 ENCOUNTER — Other Ambulatory Visit: Payer: Self-pay | Admitting: Ophthalmology

## 2012-08-08 ENCOUNTER — Ambulatory Visit (HOSPITAL_COMMUNITY): Admission: RE | Admit: 2012-08-08 | Payer: Medicaid Other | Source: Ambulatory Visit

## 2012-08-08 DIAGNOSIS — Z8673 Personal history of transient ischemic attack (TIA), and cerebral infarction without residual deficits: Secondary | ICD-10-CM

## 2012-08-08 MED ORDER — LORAZEPAM 2 MG/ML IJ SOLN: 2.0000 mg | Freq: Once | INTRAMUSCULAR | Status: DC

## 2012-08-08 NOTE — Progress Notes (Addendum)
Pt. Left unit twice today. Rn encouraged pt not to leave unit without MD's order.  Pt refused advice. Pt blood pressure was also elevated. Pt refused blood pressure med, stating he would take it upon return.  Pt. Took self off tele before leaving unit the first time. Pt went with nurse tech to credit unit the first time.  Upon return, pt did not get back on tele.   Once pt came back pt refused to get back on tele. Pt stayed in room for few minutes and left again. 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 Pt left unit alone the second time.  Security made aware after pt gone for a little over half an hour. Pt did have iv still in arm/hand.  MD made aware. Will discontinue treatment at current moment. Pt left AMA.

## 2012-08-08 NOTE — Progress Notes (Signed)
Pt bp elevated. Pt refusing bp prn med until he comes from downstairs. 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 Pt also refusing to be placed back on tele until he gets back to floor.

## 2012-08-08 NOTE — Progress Notes (Signed)
Security unable to find pt. Charge nurse currently looking for pt.

## 2012-08-08 NOTE — Progress Notes (Addendum)
Patient feelign better this AM, but still with blurred vision problems in upper field of right eye  Exam:  Awake, alert, interactive Right eye with altitudinal defect in upper field.  Strength and sensation intact.   Impression: 36 yo M with previous pontine infarct and altitudinal defect in right eye. I do not suspect stroke strongly enough to allow him to be hypertensive to this degree, but would aim for a goal of < 160 pending his MRI.   1) I have consulted ophtamology 2) Reattempt MRI brain 3) HTN control  4) ASA  Ritta Slot, MD [facility] [phone]   Patient left AMA prior to MRI or ophtho consult.

## 2012-08-08 NOTE — Care Management Note (Signed)
    Page 1 of 1   08/08/2012     3:41:29 PM   CARE MANAGEMENT NOTE 08/08/2012  Patient:  [name], [name]   Account Number:  [id number]  Date Initiated:  08/08/2012  Documentation initiated by:  Onnie Boer  Subjective/Objective Assessment:   PT WAS ADMITTED WITH WEAKNESS     Action/Plan:   PROGRESSION OF CARE AND DISCHARGE PLANNING   Anticipated DC Date:  08/08/2012   Anticipated DC Plan:  AGAINST MEDICAL ADVICE      DC Planning Services  CM consult      Choice offered to / List presented to:             Status of service:  Completed, signed off Medicare Important Message given?   (If response is "NO", the following Medicare IM given date fields will be blank) Date Medicare IM given:   Date Additional Medicare IM given:    Discharge Disposition:  AGAINST MEDICAL ADVICE  Per UR Regulation:    If discussed at Long Length of Stay Meetings, dates discussed:    Comments:  08/08/12 Onnie Boer, RN, BSN 1540 PT HAS LEFT UNIT AMA.

## 2012-08-08 NOTE — Progress Notes (Signed)
Pt missing off unit for past hour in a half. Security notified. Pt still off tele at current moment.

## 2012-08-08 NOTE — Progress Notes (Signed)
Pt took self off tele to go to [employer]. Pt said he could not wait for an order to leave floor. Will place back on tele once he returns to floor.

## 2012-08-08 NOTE — Discharge Summary (Signed)
[facility] Progress Note [place] TEAM 1 - Stepdown/ICU TEAM   [name] ZOX:[id number] DOB: 16-Jul-1976 DOA: 08/07/2012 PCP: No primary provider on file.  Brief narrative: 36 y.o. 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 He wasn't able to see well, so he covered his left eye and figure out there was a R upper half visual field defect. He denied slurred speech, upper or lower extremitiy weakness, but mild paresthesia over the left upper extremity. No ataxia. In the ER, he was found to have BP 230/120. A head CT without constrast showed old pontine infarct but no acute process. His labs were rather unremarkable with Cr of 1.4. His EKG showed rhythm to be in sinus with TWI and high voltage c/w LVH. He was given Labetelol IV and resulting BP of 177/100. Hospitalist was asked to admit him for HTN emergency.  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  The RN and Water quality scientist informed me that the pt had left the floor AMA.  Several staff members and security have made numerous  attempts to locate the pt, but they have been unsuccessful.    Objective: Blood pressure 204/123, pulse 74, temperature 98.1 F (36.7 C), temperature source Oral, resp. rate 18, height 5\' 8"  (1.727 m), weight 114.5 kg (252 lb 6.8 oz), SpO2 100.00%.  Intake/Output Summary (Last 24 hours) at 08/08/12 1428 Last data filed at 08/07/12 1550  Gross per 24 hour  Intake      0 ml  Output      0 ml  Net      0 ml     Exam: PT LEFT AMA  Data Reviewed: Basic Metabolic Panel:  Lab 08/07/12 8119 08/07/12 0145  NA 143 138  K 3.5 3.3*  CL 105 104  CO2 -- 26  GLUCOSE 99 100*  BUN 12 12  CREATININE 1.40* 1.31  CALCIUM -- 9.0  MG -- --  PHOS -- --   Liver Function Tests:  Lab 08/07/12 0145  AST 16  ALT 19  ALKPHOS 48  BILITOT 0.2*  PROT 6.8  ALBUMIN 3.5   CBC:  Lab 08/07/12 0148 08/07/12 0145  WBC -- 8.0  NEUTROABS -- 4.0  HGB 14.6 13.9  HCT 43.0 40.8  MCV -- 87.6  PLT -- 202   Cardiac Enzymes:  Lab 08/07/12 0145  CKTOTAL 265*  CKMB 2.8  CKMBINDEX --  TROPONINI <0.30   BNP (last 3 results) No results found for this basename: PROBNP:3 in the last 8760 hours CBG:  Lab 08/07/12 0213  GLUCAP 110*  Recent Results (from the past 240 hour(s))  MRSA PCR SCREENING     Status: Normal   Collection Time   08/07/12  7:03 AM      Component Value Range Status Comment   MRSA by PCR NEGATIVE  NEGATIVE Final      Studies:  Recent x-ray studies have been reviewed in detail by the Attending Physician  Scheduled Meds:  Reviewed in detail by the Attending Physician   Lonia Blood, MD [facility] Office  [phone] Pager [phone]  On-Call/Text Page:      [email or web address]      password [email or web address]  If 7PM-7AM, please contact night-coverage [email or web address] Password [email or web address] 08/08/2012, 2:28 PM   LOS: 1 day

## 2013-07-20 ENCOUNTER — Observation Stay (HOSPITAL_COMMUNITY): Payer: Medicaid Other

## 2013-07-20 ENCOUNTER — Emergency Department (HOSPITAL_COMMUNITY): Payer: Medicaid Other

## 2013-07-20 ENCOUNTER — Encounter (HOSPITAL_COMMUNITY): Payer: Self-pay | Admitting: Emergency Medicine

## 2013-07-20 ENCOUNTER — Observation Stay (HOSPITAL_COMMUNITY)
Admission: EM | Admit: 2013-07-20 | Discharge: 2013-07-20 | Disposition: A | Discharge status: Home / Self Care | Payer: Medicaid Other | Attending: Internal Medicine | Admitting: Internal Medicine

## 2013-07-20 DIAGNOSIS — Z9089 Acquired absence of other organs: Secondary | ICD-10-CM | POA: Insufficient documentation

## 2013-07-20 DIAGNOSIS — R51 Headache: Secondary | ICD-10-CM | POA: Insufficient documentation

## 2013-07-20 DIAGNOSIS — K089 Disorder of teeth and supporting structures, unspecified: Secondary | ICD-10-CM | POA: Diagnosis present

## 2013-07-20 DIAGNOSIS — Z720 Tobacco use: Secondary | ICD-10-CM | POA: Diagnosis present

## 2013-07-20 DIAGNOSIS — K044 Acute apical periodontitis of pulpal origin: Secondary | ICD-10-CM | POA: Insufficient documentation

## 2013-07-20 DIAGNOSIS — K029 Dental caries, unspecified: Secondary | ICD-10-CM | POA: Insufficient documentation

## 2013-07-20 DIAGNOSIS — I10 Essential (primary) hypertension: Principal | ICD-10-CM | POA: Diagnosis present

## 2013-07-20 DIAGNOSIS — I160 Hypertensive urgency: Secondary | ICD-10-CM | POA: Diagnosis present

## 2013-07-20 DIAGNOSIS — Z8673 Personal history of transient ischemic attack (TIA), and cerebral infarction without residual deficits: Secondary | ICD-10-CM

## 2013-07-20 DIAGNOSIS — F17200 Nicotine dependence, unspecified, uncomplicated: Secondary | ICD-10-CM | POA: Insufficient documentation

## 2013-07-20 DIAGNOSIS — K047 Periapical abscess without sinus: Secondary | ICD-10-CM | POA: Insufficient documentation

## 2013-07-20 DIAGNOSIS — I119 Hypertensive heart disease without heart failure: Secondary | ICD-10-CM | POA: Diagnosis present

## 2013-07-20 DIAGNOSIS — I517 Cardiomegaly: Secondary | ICD-10-CM | POA: Insufficient documentation

## 2013-07-20 LAB — CBC WITH DIFFERENTIAL/PLATELET
Eosinophils Relative: 4 % (ref 0–5)
HCT: 42.1 % (ref 39.0–52.0)
Lymphocytes Relative: 37 % (ref 12–46)
Lymphs Abs: 3.1 10*3/uL (ref 0.7–4.0)
MCV: 87.5 fL (ref 78.0–100.0)
Monocytes Absolute: 0.6 10*3/uL (ref 0.1–1.0)
RBC: 4.81 MIL/uL (ref 4.22–5.81)
WBC: 8.5 10*3/uL (ref 4.0–10.5)

## 2013-07-20 LAB — COMPREHENSIVE METABOLIC PANEL
ALT: 24 U/L (ref 0–53)
AST: 19 U/L (ref 0–37)
Albumin: 3.7 g/dL (ref 3.5–5.2)
CO2: 24 mEq/L (ref 19–32)
Calcium: 9.5 mg/dL (ref 8.4–10.5)
Chloride: 99 mEq/L (ref 96–112)
Creatinine, Ser: 1.22 mg/dL (ref 0.50–1.35)
GFR calc non Af Amer: 74 mL/min — ABNORMAL LOW (ref >90)
Sodium: 137 mEq/L (ref 135–145)

## 2013-07-20 LAB — TROPONIN I: Troponin I: <0.3 ng/mL (ref <0.30)

## 2013-07-20 MED ORDER — LISINOPRIL 20 MG PO TABS
20.0000 mg | ORAL_TABLET | Freq: Every day | ORAL | Status: DC
  Administered 2013-07-20: 20 mg via ORAL
  Filled 2013-07-20: qty 1

## 2013-07-20 MED ORDER — MORPHINE SULFATE 4 MG/ML IJ SOLN
4.0000 mg | Freq: Once | INTRAMUSCULAR | Status: AC
  Administered 2013-07-20: 4 mg via INTRAVENOUS
  Filled 2013-07-20: qty 1

## 2013-07-20 MED ORDER — LABETALOL HCL 5 MG/ML IV SOLN
20.0000 mg | Freq: Once | INTRAVENOUS | Status: AC
  Administered 2013-07-20: 20 mg via INTRAVENOUS
  Filled 2013-07-20: qty 4

## 2013-07-20 MED ORDER — ASPIRIN EC 81 MG PO TBEC: 81.0000 mg | DELAYED_RELEASE_TABLET | Freq: Every day | ORAL | Status: DC

## 2013-07-20 MED ORDER — HYDROCHLOROTHIAZIDE 25 MG PO TABS
25.0000 mg | ORAL_TABLET | Freq: Every day | ORAL | Status: DC
  Administered 2013-07-20: 25 mg via ORAL
  Filled 2013-07-20: qty 1

## 2013-07-20 MED ORDER — HYDROCHLOROTHIAZIDE 25 MG PO TABS: 25.0000 mg | ORAL_TABLET | Freq: Every day | ORAL | Status: DC

## 2013-07-20 MED ORDER — NICOTINE 21 MG/24HR TD PT24: 1.0000 | MEDICATED_PATCH | TRANSDERMAL | Status: DC

## 2013-07-20 MED ORDER — HEPARIN SODIUM (PORCINE) 5000 UNIT/ML IJ SOLN
5000.0000 [IU] | Freq: Three times a day (TID) | INTRAMUSCULAR | Status: DC
  Administered 2013-07-20: 5000 [IU] via SUBCUTANEOUS
  Filled 2013-07-20 (×4): qty 1

## 2013-07-20 MED ORDER — SODIUM CHLORIDE 0.9 % IV SOLN
Freq: Once | INTRAVENOUS | Status: AC
  Administered 2013-07-20: 1000 mL via INTRAVENOUS

## 2013-07-20 MED ORDER — IBUPROFEN 200 MG PO TABS
200.0000 mg | ORAL_TABLET | Freq: Four times a day (QID) | ORAL | Status: DC | PRN
  Administered 2013-07-20: 200 mg via ORAL
  Filled 2013-07-20: qty 1

## 2013-07-20 MED ORDER — ONDANSETRON HCL 4 MG/2ML IJ SOLN
4.0000 mg | Freq: Once | INTRAMUSCULAR | Status: AC
  Administered 2013-07-20: 4 mg via INTRAVENOUS
  Filled 2013-07-20: qty 2

## 2013-07-20 MED ORDER — LISINOPRIL 20 MG PO TABS: 20.0000 mg | ORAL_TABLET | Freq: Every day | ORAL | Status: DC

## 2013-07-20 NOTE — H&P (Signed)
[facility] History and Physical  [name] ZOX:[id number] DOB: October 24, 1976    PCP:   Patient plans to see Dr. Parke Simmers, has not seen yet.  Chief Complaint: facial pain, fever, hypertension  HPI: [name] is an 37 y.o. male with history of long standing severe untreated hypertension, CVA at age 41 with no residual deficit, ongoing tobacco abuse who presents today with complaint of facial/dental pain, headache and concern that his blood pressure may be high.  He does not have a PCP, but plans to establish this week with Dr. Parke Simmers.  He does not check his blood pressure at home.  For the past 3-4 days he has had increasing pain in the left lower jaw due to dental infection.  He has had sweats and subjective fever.  He noticed that when sitting still he could hear his heartbeat and also has had a headache.  Due to his history of stroke and uncontrolled hypertension he came to the ED for evaluation.  His initial BP in the ED was 229/112, has decreased to 164/80 with one dose of labetalol.  TR[facility] is asked to admit for management of hypertensive urgency.    Rewiew of Systems:  Constitutional: endorses fevers, chills, sweats, malaise Eyes: no visual change or eye pain ENMT: pain in the left lower jaw and left face, no difficulty swallowing, no pain in ear, no throat pain or swelling Cardiovascular: Negative for chest pain, palpitations, diaphoresis, dyspnea and peripheral edema. ; No orthopnea, PND Respiratory: Negative for cough, hemoptysis, wheezing and stridor. No pleuritic chestpain. Gastrointestinal: Negative for nausea, vomiting, diarrhea, constipation, abdominal pain, melena, blood in stool, hematemesis, jaundice and rectal bleeding.    Genitourinary: Negative for frequency, dysuria, incontinence,flank pain and hematuria; Musculoskeletal:  Negative for swelling and trauma.;  Skin: . Negative for rash, abrasions, bruising or ulceration Neuro:  Negative for weakness, numbness, altered  level of consciousness,     Past Medical History  Diagnosis Date  . Stroke   . Hypertension     Past Surgical History  Procedure Laterality Date  . Appendectomy    . Cholecystectomy      Medications:  HOME MEDS: Prior to Admission medications   Medication Sig Start Date End Date Taking? Authorizing Provider  ibuprofen (ADVIL,MOTRIN) 200 MG tablet Take 200 mg by mouth every 6 (six) hours as needed for pain (pain).   Yes Historical Provider, MD     Allergies:  No Known Allergies  Social History:   He has a 15 pack-year smoking history, currently smokes at least a pack a day. He does not have any smokeless tobacco history on file. He reports that he does not drink alcohol or use illicit drugs. Lives at home with his mother.  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  Family History: Family History  Problem Relation Age of Onset  . Stroke grandparents      Physical Exam: Filed Vitals:   07/20/13 0345 07/20/13 0400 07/20/13 0415 07/20/13 0430  BP: 174/91 180/90 177/80 164/80  Pulse:      Temp:      TempSrc:      Resp: 17 18 15 16   SpO2:       Blood pressure 164/80, pulse 88, temperature 98.4 F (36.9 C), temperature source Oral, resp. rate 16, SpO2 98.00%.  GEN:  Pleasant, in no acute distress; cooperative with exam. PSYCH:  alert and oriented x4; does not appear anxious or depressed; affect is  appropriate. HEENT: oral mucosa pink and moist, poor dentition, no overt sign of infection or abcess left lower jaw is tender, posterior oropharynx is clear, PERRL, EOMI, conjunctiva very injected bilaterally, neck supple, no LAD, no JVD CHEST: Normal respiration, clear to auscultation bilaterally.  HEART: Regular rate and rhythm.  There are no murmur, rub, or gallops.   BACK: No kyphosis or scoliosis; no CVA tenderness ABDOMEN: soft and non-tender; no masses, no organomegaly, normal abdominal bowel sounds; distended, obese Rectal Exam:  Not done EXTREMITIES: No bone or joint deformity; no calf tenderness, no edema Genitalia: not examined PULSES: 1+ and symmetric SKIN: Normal hydration no rash or ulceration CNS: Cranial nerves 2-12 grossly intact no focal lateralizing neurologic deficit.  Speech is fluent   Labs on Admission:  Basic Metabolic Panel: No results found for this basename: NA, K, CL, CO2, GLUCOSE, BUN, CREATININE, CALCIUM, MG, PHOS,  in the last 168 hours Liver Function Tests: No results found for this basename: AST, ALT, ALKPHOS, BILITOT, PROT, ALBUMIN,  in the last 168 hours No results found for this basename: LIPASE, AMYLASE,  in the last 168 hours No results found for this basename: AMMONIA,  in the last 168 hours CBC:  Recent Labs Lab 07/20/13 0310  WBC 8.5  NEUTROABS 4.4  HGB 14.9  HCT 42.1  MCV 87.5  PLT 206   Cardiac Enzymes:  Recent Labs Lab 07/20/13 0310  TROPONINI <0.30    CBG: No results found for this basename: GLUCAP,  in the last 168 hours   Radiological Exams on Admission: Dg Chest 2 View  07/20/2013   *RADIOLOGY REPORT*  Clinical Data: Hypertension  CHEST - 2 VIEW  Comparison: Prior radiograph from 03/05/2010  Findings: Cardiac and mediastinal silhouettes are within normal limits.  The lungs are normally inflated.  No airspace consolidation, pleural effusion, or pulmonary edema is identified. There is no pneumothorax.  No acute osseous abnormality.  IMPRESSION: No acute cardiopulmonary process.   Original Report Authenticated By: Rise Mu, M.D.   Ct Head Wo Contrast  07/20/2013   *RADIOLOGY REPORT*  Clinical Data: Hypertensive, history of CVA  CT HEAD WITHOUT CONTRAST  Technique:  Contiguous axial images were obtained from the base of the skull through the vertex without contrast.  Comparison: Prior CT from 08/07/2012  Findings: There is no acute intracranial hemorrhage or infarct.  No mass or midline shift.  CSF containing spaces are normal.  Wallace Cullens- white matter  differentiation is preserved.  No extra-axial fluid collection.  Calvarium is intact.  Orbital soft tissues are normal.  Paranasal sinuses and mastoid air cells are clear.  IMPRESSION: Normal head CT with no acute intracranial process.   Original Report Authenticated By: Rise Mu, M.D.    EKG: Independently reviewed. LVH  Assessment/Plan 1)   Hypertensive urgency: very elevated blood pressure with headache, history of stroke.  Blood pressure much improved at this time.  Admit to tele for observation.  Start HCTZ.  Education on heart healthy diet. Encouraged to follow up with a primary care provider. Check renal and hepatic function. No sign of acute change on EKG, initial troponin negative, no complaint of chest pain do not suspect current ACS or strain. 2) severe concentric LVH on ECHO 2011- likely due to long standing uncontrolled HTN.  Manage HTN. Will not repeat ECHO at this time. 3) dental infection: exam does not identify current infection. CT of the head does not show abscess or cellulitis.  Suspect that pain is more likely due to exposed  nerve, rather than infection. 4) tobacco abuse: counseled regarding cessation 5) history of CVA: no focal defects at this time. Discussed risk of recurrence if BP remains uncontrolled.   Code Status: full  Time on admission: 45 minutes  WALSH,CATHERINE, MD. [facility] Pager [phone] 7pm to 7am.  07/20/2013, 5:34 AM

## 2013-07-20 NOTE — Care Management Note (Signed)
    Page 1 of 1   07/20/2013     12:35:16 PM   CARE MANAGEMENT NOTE 07/20/2013  Patient:  [name], [name]   Account Number:  [id number]  Date Initiated:  07/20/2013  Documentation initiated by:  Lanier Clam  Subjective/Objective Assessment:   ADMITTED [name]     Action/Plan:   FROM HOME.   Anticipated DC Date:  07/20/2013   Anticipated DC Plan:  HOME/SELF CARE      DC Planning Services  CM consult      Choice offered to / List presented to:             Status of service:  Completed, signed off Medicare Important Message given?   (If response is "NO", the following Medicare IM given date fields will be blank) Date Medicare IM given:   Date Additional Medicare IM given:    Discharge Disposition:  HOME/SELF CARE  Per UR Regulation:  Reviewed for med. necessity/level of care/duration of stay  If discussed at Long Length of Stay Meetings, dates discussed:    Comments:  07/20/13 Brandon Hreha RN,BSN NCM [phone] D/C HOME NO NEEDS OR ORDERS.

## 2013-07-20 NOTE — Progress Notes (Signed)
Patient declines MyChart activation-not interested

## 2013-07-20 NOTE — ED Provider Notes (Signed)
Date: 07/20/2013  Rate: 89  Rhythm: normal sinus rhythm  QRS Axis: normal  Intervals: normal  ST/T Wave abnormalities: ST depression and T wave inversion in multiple leads consistent with left ventricular hypertrophy  Conduction Disutrbances:none  Narrative Interpretation: Left ventricular hypertrophy with secondary repolarization changes. When compared with ECG of 08/07/2012, no significant changes are seen.  Old EKG Reviewed: unchanged  Medical screening examination/treatment/procedure(s) were performed by non-physician practitioner and as supervising physician I was immediately available for consultation/collaboration.   Dione Booze, MD 07/20/13 [phone]

## 2013-07-20 NOTE — ED Notes (Signed)
Pt arrived to the ED with a complaint of mouth pain that started Friday.  Pt has a blue black spot on the inside of his cheek on the left side.  Pt has hypertension and is not on medication and his blood pressure is 228/116

## 2013-07-20 NOTE — ED Provider Notes (Signed)
CSN: [id number]     Arrival date & time 07/20/13  0137 History     First MD Initiated Contact with Patient 07/20/13 0220     Chief Complaint  Patient presents with  . Hypertension  . Dental Pain   (Consider location/radiation/quality/duration/timing/severity/associated sxs/prior Treatment) HPI Comments: Presents tonight with history of dental pain, not relieved by ibuprofen.  He is also been noncompliant with his hypertensive.  Treatment.  Has not taken medication, and "a while" but he does report increase in headaches, intermittent, visual disturbances.  He, states, about 3 months ago.  He lost total vision in his left eye for about one week and then spontaneously, returned.  He recently just obtained a primary care physician, and a dentist, stating he has appointments with both this week. He does have a history of having had a CVA in 2007  Patient is a 37 y.o. male presenting with hypertension and tooth pain. The history is provided by the patient.  Hypertension This is a recurrent problem. The current episode started more than 1 year ago. The problem occurs intermittently. The problem has been gradually worsening. Associated symptoms include headaches. Pertinent negatives include no chest pain, chills or fever. He has tried NSAIDs for the symptoms.  Dental Pain Associated symptoms: headaches   Associated symptoms: no fever     Past Medical History  Diagnosis Date  . Stroke   . Hypertension    History reviewed. No pertinent past surgical history. History reviewed. No pertinent family history. History  Substance Use Topics  . Smoking status: Current Every Day Smoker -- 1.00 packs/day    Types: Cigarettes  . Smokeless tobacco: Not on file  . Alcohol Use: No    Review of Systems  Constitutional: Negative for fever and chills.  HENT: Positive for dental problem.   Eyes: Positive for visual disturbance.  Cardiovascular: Negative for chest pain and leg swelling.  Neurological:  Positive for headaches. Negative for dizziness.  All other systems reviewed and are negative.    Allergies  Review of patient's allergies indicates no known allergies.  Home Medications   Current Outpatient Rx  Name  Route  Sig  Dispense  Refill  . ibuprofen (ADVIL,MOTRIN) 200 MG tablet   Oral   Take 200 mg by mouth every 6 (six) hours as needed for pain (pain).          BP 164/80  Pulse 88  Temp(Src) 98.4 F (36.9 C) (Oral)  Resp 16  SpO2 98% Physical Exam  Nursing note and vitals reviewed. Constitutional: He is oriented to person, place, and time. He appears well-developed and well-nourished.  HENT:  Head: Normocephalic and atraumatic.  Mouth/Throat: Oropharynx is clear and moist.  Eyes: Pupils are equal, round, and reactive to light.  Neck: Normal range of motion.  Cardiovascular: Normal rate and regular rhythm.   Pulmonary/Chest: Effort normal. No respiratory distress. He has no wheezes.  Abdominal: Soft.  Musculoskeletal: Normal range of motion. He exhibits no tenderness.  Lymphadenopathy:    He has no cervical adenopathy.  Neurological: He is alert and oriented to person, place, and time.  Skin: Skin is warm and dry.    ED Course   Procedures (including critical care time)  Labs Reviewed  CBC WITH DIFFERENTIAL  TROPONIN I   Dg Chest 2 View  07/20/2013   *RADIOLOGY REPORT*  Clinical Data: Hypertension  CHEST - 2 VIEW  Comparison: Prior radiograph from 03/05/2010  Findings: Cardiac and mediastinal silhouettes are within normal limits.  The lungs are normally inflated.  No airspace consolidation, pleural effusion, or pulmonary edema is identified. There is no pneumothorax.  No acute osseous abnormality.  IMPRESSION: No acute cardiopulmonary process.   Original Report Authenticated By: Rise Mu, M.D.   Ct Head Wo Contrast  07/20/2013   *RADIOLOGY REPORT*  Clinical Data: Hypertensive, history of CVA  CT HEAD WITHOUT CONTRAST  Technique:  Contiguous  axial images were obtained from the base of the skull through the vertex without contrast.  Comparison: Prior CT from 08/07/2012  Findings: There is no acute intracranial hemorrhage or infarct.  No mass or midline shift.  CSF containing spaces are normal.  Wallace Cullens- white matter differentiation is preserved.  No extra-axial fluid collection.  Calvarium is intact.  Orbital soft tissues are normal.  Paranasal sinuses and mastoid air cells are clear.  IMPRESSION: Normal head CT with no acute intracranial process.   Original Report Authenticated By: Rise Mu, M.D.   1. Hypertensive urgency     MDM  Will admit patient for hypertensive urgency.  He is having good response from IV labetalol   Arman Filter, NP 07/20/13 1610  Arman Filter, NP 07/20/13 9604  Arman Filter, NP 07/20/13 [phone]

## 2013-07-20 NOTE — Discharge Summary (Signed)
Physician Discharge Summary  [name]:[id number] DOB: 02-11-1976 DOA: 07/20/2013  PCP: No primary provider on file.  Admit date: 07/20/2013 Discharge date: 07/20/2013  Time spent: >30 minutes  Recommendations for Outpatient Follow-up:  1. BMET to follow electrolytes and kidney function 2. 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 Will follow with PCP in 10 days  Diet recommendation: heart healthy diet  Filed Weights   07/20/13 0649  Weight: 111.902 kg (246 lb 11.2 oz)    History of present illness:  37 y.o. male with history of long standing severe untreated hypertension, CVA at age 59 with no residual deficit, ongoing tobacco abuse who presents today with complaint of facial/dental pain, headache and concern that his blood pressure may be high. He does not have a PCP, but plans to establish this week with Dr. Parke Simmers. He does not check his blood pressure at home. For the past 3-4 days he has had increasing pain in the left lower jaw due to dental infection. He has had sweats and subjective fever. He noticed that when sitting still he could hear his heartbeat and also has had a headache. Due to his history of stroke and uncontrolled hypertension he came to the ED for evaluation. His initial BP in the ED was 229/112, has decreased to 164/80 with one dose of labetalol. TR[facility] is asked to admit for management of hypertensive urgency.   Hospital Course:  1)Hypertensive urgency: very elevated blood pressure with headache, history of stroke. Blood pressure much improved after receiving labetalol IV in the ED.  -Patient will be discharge on lisinopril and HCTZ  -follow up  with PCP in 10 days for further adjustments -advise to follow a heart healthy diet  2) severe concentric LVH on ECHO 2011- likely due to long standing uncontrolled HTN.  -will control HTN -advise to follow heart healthy diet and stop smoking  3) dental infection: panorex demonstrating just bad teeth and cavities -advise to set up appointment with dentist  4) tobacco abuse: counseled regarding cessation   5) history of CVA: no focal defects at this time. Discussed risk of recurrence if BP remains uncontrolled.  -continue ASA for secondary prevention -control of BP -stop smoking  Procedures:  See below for x-ray reports  Consultations:  none  Discharge Exam: Filed Vitals:   07/20/13 0649  BP: 175/82  Pulse: 77  Temp: 98 F (36.7 C)  Resp: 18    General: NAD, afebrile, denies SOB or CP Cardiovascular: S1 and S2, no rubs, no gallops or murmur  Respiratory: CTA bilaterally Abdomen: soft, NT, ND, positive BS Neuro:non focal motor or sensory deficit.   Discharge Instructions  Discharge Orders   Future Orders Complete By Expires   Diet - low sodium heart healthy  As directed    Discharge instructions  As directed    Comments:     Arrange follow up appointment with PCP in 2 weeks Follow a low sodium diet Take medications as prescribed Stop smoking       Medication List         aspirin EC 81 MG tablet  Take 1 tablet (81 mg total) by mouth daily.     hydrochlorothiazide 25 MG tablet  Commonly known as:  HYDRODIURIL  Take 1 tablet (25 mg total) by mouth daily.     ibuprofen 200 MG tablet  Commonly known as:  ADVIL,MOTRIN  Take 200 mg by mouth every 6 (six) hours as needed for pain (pain).     lisinopril 20 MG tablet  Commonly known as:  PRINIVIL,ZESTRIL  Take 1 tablet (20 mg total) by mouth daily.     nicotine 21 mg/24hr patch  Commonly known as:  NICODERM CQ  Place 1 patch onto the skin daily.       No Known Allergies     Follow-up Information    Follow up with Geraldo Pitter, MD. Schedule an appointment as soon as possible for a visit in 10 days.   Specialty:  Family Medicine   Contact information:   [address] [place] [address] [phone]        The results of significant diagnostics from this hospitalization (including imaging, microbiology, ancillary and laboratory) are listed below for reference.    Significant Diagnostic Studies: Dg Orthopantogram  07/20/2013   *RADIOLOGY REPORT*  Clinical Data: Lower jar pain for 3 days.  ORTHOPANTOGRAM/PANORAMIC  Comparison: None.  Findings: Prominent caries. No obvious apical abscess.  IMPRESSION: Prominent caries.  No obvious apical abscess.  No bony destructive lesion.   Original Report Authenticated By: Lacy Duverney, M.D.   Dg Chest 2 View  07/20/2013   *RADIOLOGY REPORT*  Clinical Data: Hypertension  CHEST - 2 VIEW  Comparison: Prior radiograph from 03/05/2010  Findings: Cardiac and mediastinal silhouettes are within normal limits.  The lungs are normally inflated.  No airspace consolidation, pleural effusion, or pulmonary edema is identified. There is no pneumothorax.  No acute osseous abnormality.  IMPRESSION: No acute cardiopulmonary process.   Original Report Authenticated By: Rise Mu, M.D.   Ct Head Wo Contrast  07/20/2013   *RADIOLOGY REPORT*  Clinical Data: Hypertensive, history of CVA  CT HEAD WITHOUT CONTRAST  Technique:  Contiguous axial images were obtained from the base of the skull through the vertex without contrast.  Comparison: Prior CT from 08/07/2012  Findings: There is no acute intracranial hemorrhage or infarct.  No mass or midline shift.  CSF containing spaces are normal.  Wallace Cullens- white matter differentiation is preserved.  No extra-axial fluid collection.  Calvarium is intact.  Orbital soft tissues are normal.  Paranasal sinuses and mastoid air cells are clear.  IMPRESSION: Normal head CT with no acute intracranial process.   Original Report  Authenticated By: Rise Mu, M.D.   Labs: Basic Metabolic Panel:  Recent Labs Lab 07/20/13 0310  NA 137  K 3.2*  CL 99  CO2 24  GLUCOSE 106*  BUN 11  CREATININE 1.22  CALCIUM 9.5   Liver Function Tests:  Recent Labs Lab 07/20/13 0310  AST 19  ALT 24  ALKPHOS 56  BILITOT 0.2*  PROT 7.5  ALBUMIN 3.7   CBC:  Recent Labs Lab 07/20/13 0310  WBC 8.5  NEUTROABS 4.4  HGB 14.9  HCT 42.1  MCV 87.5  PLT 206   Cardiac Enzymes:  Recent Labs Lab 07/20/13 0310  TROPONINI <0.30    Signed:  Reiner Loewen  [facility] 07/20/2013, 10:33 AM

## 2013-12-13 DIAGNOSIS — R109 Unspecified abdominal pain: Secondary | ICD-10-CM | POA: Insufficient documentation

## 2013-12-13 DIAGNOSIS — Z7982 Long term (current) use of aspirin: Secondary | ICD-10-CM | POA: Insufficient documentation

## 2013-12-13 DIAGNOSIS — R079 Chest pain, unspecified: Secondary | ICD-10-CM | POA: Insufficient documentation

## 2013-12-13 DIAGNOSIS — R0609 Other forms of dyspnea: Secondary | ICD-10-CM | POA: Insufficient documentation

## 2013-12-13 DIAGNOSIS — R110 Nausea: Secondary | ICD-10-CM | POA: Insufficient documentation

## 2013-12-13 DIAGNOSIS — I10 Essential (primary) hypertension: Secondary | ICD-10-CM | POA: Insufficient documentation

## 2013-12-13 DIAGNOSIS — Z79899 Other long term (current) drug therapy: Secondary | ICD-10-CM | POA: Insufficient documentation

## 2013-12-13 DIAGNOSIS — Z91199 Patient's noncompliance with other medical treatment and regimen due to unspecified reason: Secondary | ICD-10-CM | POA: Insufficient documentation

## 2013-12-13 DIAGNOSIS — Z9119 Patient's noncompliance with other medical treatment and regimen: Secondary | ICD-10-CM | POA: Insufficient documentation

## 2013-12-13 DIAGNOSIS — F17200 Nicotine dependence, unspecified, uncomplicated: Secondary | ICD-10-CM | POA: Insufficient documentation

## 2013-12-13 DIAGNOSIS — E669 Obesity, unspecified: Secondary | ICD-10-CM | POA: Insufficient documentation

## 2013-12-13 DIAGNOSIS — R0989 Other specified symptoms and signs involving the circulatory and respiratory systems: Secondary | ICD-10-CM | POA: Insufficient documentation

## 2013-12-13 DIAGNOSIS — Z8673 Personal history of transient ischemic attack (TIA), and cerebral infarction without residual deficits: Secondary | ICD-10-CM | POA: Insufficient documentation

## 2013-12-14 ENCOUNTER — Emergency Department (HOSPITAL_COMMUNITY): Payer: Medicaid Other

## 2013-12-14 ENCOUNTER — Emergency Department (HOSPITAL_COMMUNITY)
Admission: EM | Admit: 2013-12-14 | Discharge: 2013-12-14 | Disposition: A | Discharge status: Home / Self Care | Payer: Medicaid Other | Attending: Emergency Medicine | Admitting: Emergency Medicine

## 2013-12-14 ENCOUNTER — Encounter (HOSPITAL_COMMUNITY): Payer: Self-pay | Admitting: Emergency Medicine

## 2013-12-14 DIAGNOSIS — Z9114 Patient's other noncompliance with medication regimen: Secondary | ICD-10-CM

## 2013-12-14 DIAGNOSIS — I10 Essential (primary) hypertension: Secondary | ICD-10-CM

## 2013-12-14 DIAGNOSIS — R079 Chest pain, unspecified: Secondary | ICD-10-CM

## 2013-12-14 HISTORY — DX: Obesity, unspecified: E66.9

## 2013-12-14 LAB — URINALYSIS, ROUTINE W REFLEX MICROSCOPIC
Bilirubin Urine: NEGATIVE
GLUCOSE, UA: NEGATIVE mg/dL
Hgb urine dipstick: NEGATIVE
Ketones, ur: NEGATIVE mg/dL
LEUKOCYTES UA: NEGATIVE
NITRITE: NEGATIVE
PH: 7 (ref 5.0–8.0)
Protein, ur: NEGATIVE mg/dL
SPECIFIC GRAVITY, URINE: 1.016 (ref 1.005–1.030)
Urobilinogen, UA: 1 mg/dL (ref 0.0–1.0)

## 2013-12-14 LAB — BASIC METABOLIC PANEL
BUN: 12 mg/dL (ref 6–23)
CO2: 24 meq/L (ref 19–32)
Calcium: 9 mg/dL (ref 8.4–10.5)
Chloride: 101 mEq/L (ref 96–112)
Creatinine, Ser: 1.21 mg/dL (ref 0.50–1.35)
GFR calc Af Amer: 86 mL/min — ABNORMAL LOW (ref >90)
GFR calc non Af Amer: 75 mL/min — ABNORMAL LOW (ref >90)
Glucose, Bld: 143 mg/dL — ABNORMAL HIGH (ref 70–99)
POTASSIUM: 3.4 meq/L — AB (ref 3.7–5.3)
Sodium: 139 mEq/L (ref 137–147)

## 2013-12-14 LAB — POCT I-STAT TROPONIN I: Troponin i, poc: 0.04 ng/mL (ref 0.00–0.08)

## 2013-12-14 LAB — D-DIMER, QUANTITATIVE (NOT AT ARMC): D DIMER QUANT: 0.85 ug{FEU}/mL — AB (ref 0.00–0.48)

## 2013-12-14 LAB — CBC
HEMATOCRIT: 39.3 % (ref 39.0–52.0)
Hemoglobin: 13 g/dL (ref 13.0–17.0)
MCH: 29.7 pg (ref 26.0–34.0)
MCHC: 33.1 g/dL (ref 30.0–36.0)
MCV: 89.9 fL (ref 78.0–100.0)
PLATELETS: 211 10*3/uL (ref 150–400)
RBC: 4.37 MIL/uL (ref 4.22–5.81)
RDW: 12.9 % (ref 11.5–15.5)
WBC: 7.3 10*3/uL (ref 4.0–10.5)

## 2013-12-14 MED ORDER — KETOROLAC TROMETHAMINE 30 MG/ML IJ SOLN
30.0000 mg | Freq: Once | INTRAMUSCULAR | Status: AC
  Administered 2013-12-14: 30 mg via INTRAVENOUS
  Filled 2013-12-14: qty 1

## 2013-12-14 MED ORDER — ASPIRIN 81 MG PO CHEW
324.0000 mg | CHEWABLE_TABLET | Freq: Once | ORAL | Status: AC
  Administered 2013-12-14: 324 mg via ORAL
  Filled 2013-12-14: qty 4

## 2013-12-14 MED ORDER — LORAZEPAM 2 MG/ML IJ SOLN
INTRAMUSCULAR | Status: AC
  Filled 2013-12-14: qty 1

## 2013-12-14 MED ORDER — LORAZEPAM 2 MG/ML IJ SOLN
1.0000 mg | Freq: Once | INTRAMUSCULAR | Status: AC
  Administered 2013-12-14: 1 mg via INTRAVENOUS
  Filled 2013-12-14: qty 1

## 2013-12-14 MED ORDER — ONDANSETRON HCL 4 MG/2ML IJ SOLN
4.0000 mg | Freq: Once | INTRAMUSCULAR | Status: AC
  Administered 2013-12-14: 4 mg via INTRAVENOUS
  Filled 2013-12-14: qty 2

## 2013-12-14 MED ORDER — IOHEXOL 350 MG/ML SOLN
100.0000 mL | Freq: Once | INTRAVENOUS | Status: AC | PRN
  Administered 2013-12-14: 100 mL via INTRAVENOUS

## 2013-12-14 NOTE — ED Notes (Signed)
MRI contacted. Estimated MR time frame is ~ 0730. Pt sleeping/ resting. NAD, calm, updated.

## 2013-12-14 NOTE — ED Notes (Signed)
Pt to CT, urine sent.

## 2013-12-14 NOTE — Discharge Instructions (Signed)
Chest Pain (Nonspecific) °It is often hard to give a specific diagnosis for the cause of chest pain. There is always a chance that your pain could be related to something serious, such as a heart attack or a blood clot in the lungs. You need to follow up with your caregiver for further evaluation. °CAUSES  °· Heartburn. °· Pneumonia or bronchitis. °· Anxiety or stress. °· Inflammation around your heart (pericarditis) or lung (pleuritis or pleurisy). °· A blood clot in the lung. °· A collapsed lung (pneumothorax). It can develop suddenly on its own (spontaneous pneumothorax) or from injury (trauma) to the chest. °· Shingles infection (herpes zoster virus). °The chest wall is composed of bones, muscles, and cartilage. Any of these can be the source of the pain. °· The bones can be bruised by injury. °· The muscles or cartilage can be strained by coughing or overwork. °· The cartilage can be affected by inflammation and become sore (costochondritis). °DIAGNOSIS  °Lab tests or other studies, such as X-rays, electrocardiography, stress testing, or cardiac imaging, may be needed to find the cause of your pain.  °TREATMENT  °· Treatment depends on what may be causing your chest pain. Treatment may include: °· Acid blockers for heartburn. °· Anti-inflammatory medicine. °· Pain medicine for inflammatory conditions. °· Antibiotics if an infection is present. °· You may be advised to change lifestyle habits. This includes stopping smoking and avoiding alcohol, caffeine, and chocolate. °· You may be advised to keep your head raised (elevated) when sleeping. This reduces the chance of acid going backward from your stomach into your esophagus. °· Most of the time, nonspecific chest pain will improve within 2 to 3 days with rest and mild pain medicine. °HOME CARE INSTRUCTIONS  °· If antibiotics were prescribed, take your antibiotics as directed. Finish them even if you start to feel better. °· For the next few days, avoid physical  activities that bring on chest pain. Continue physical activities as directed. °· Do not smoke. °· Avoid drinking alcohol. °· Only take over-the-counter or prescription medicine for pain, discomfort, or fever as directed by your caregiver. °· Follow your caregiver's suggestions for further testing if your chest pain does not go away. °· Keep any follow-up appointments you made. If you do not go to an appointment, you could develop lasting (chronic) problems with pain. If there is any problem keeping an appointment, you must call to reschedule. °SEEK MEDICAL CARE IF:  °· You think you are having problems from the medicine you are taking. Read your medicine instructions carefully. °· Your chest pain does not go away, even after treatment. °· You develop a rash with blisters on your chest. °SEEK IMMEDIATE MEDICAL CARE IF:  °· You have increased chest pain or pain that spreads to your arm, neck, jaw, back, or abdomen. °· You develop shortness of breath, an increasing cough, or you are coughing up blood. °· You have severe back or abdominal pain, feel nauseous, or vomit. °· You develop severe weakness, fainting, or chills. °· You have a fever. °THIS IS AN EMERGENCY. Do not wait to see if the pain will go away. Get medical help at once. Call your local emergency services (911 in [place] Do not drive yourself to the hospital. °MAKE SURE YOU:  °· Understand these instructions. °· Will watch your condition. °· Will get help right away if you are not doing well or get worse. °Document Released: 08/29/2005 Document Revised: 02/11/2012 Document Reviewed: 06/24/2008 °ExitCare® Patient Information ©2014 [employer],   LLC. ° ° ° °Emergency Department Resource Guide °1) Find a Doctor and Pay Out of Pocket °Although you won't have to find out who is covered by your insurance plan, it is a good idea to ask around and get recommendations. You will then need to call the office and see if the doctor you have chosen will accept you as a new  patient and what types of options they offer for patients who are self-pay. Some doctors offer discounts or will set up payment plans for their patients who do not have insurance, but you will need to ask so you aren't surprised when you get to your appointment. ° °2) Contact Your Local Health Department °Not all health departments have doctors that can see patients for sick visits, but many do, so it is worth a call to see if yours does. If you don't know where your local health department is, you can check in your phone book. The CDC also has a tool to help you locate your state's health department, and many state websites also have listings of all of their local health departments. ° °3) Find a Walk-in Clinic °If your illness is not likely to be very severe or complicated, you may want to try a walk in clinic. These are popping up all over the country in pharmacies, drugstores, and shopping centers. They're usually staffed by nurse practitioners or physician assistants that have been trained to treat common illnesses and complaints. They're usually fairly quick and inexpensive. However, if you have serious medical issues or chronic medical problems, these are probably not your best option. ° °No Primary Care Doctor: °- Call [facility] at  832-8000 - they can help you locate a primary care doctor that  accepts your insurance, provides certain services, etc. °- Physician Referral Service- 1-800-533-3463 ° °Chronic Pain Problems: °Organization         Address  Phone   Notes  °[facility]  (336) 297-2271 Patients need to be referred by their primary care doctor.  ° °Medication Assistance: °Organization         Address  Phone   Notes  °[place] [facility] [address] °[place], [place] [address] (336) 641-8030 --Must be a resident of [place] °-- Must have NO insurance coverage whatsoever (no Medicaid/ Medicare, etc.) °-- The pt. MUST have a primary  care doctor that directs their care regularly and follows them in the community °  °[facility]  (866) 331-1348   °[employer]  (888) 892-1162   ° °Agencies that provide inexpensive medical care: °Organization         Address  Phone   Notes  °[facility]  (336) 832-8035   °[facility]    (336) 832-7272   °[facility] [address] °[place],  [address] (336) 832-4777   °[facility] [address], °[place] (336) 271-4999   °[facility]    (336) 373-0678   °[facility]    (336) 272-1050   °[facility] ° [address], [place] Phone:  (336) 832-4444, Fax:  (336) 832-4440 Hours of Operation:  9 am - 6 pm, M-F.  Also accepts Medicaid/Medicare and self-pay.  °[facility] ° [address], [place] Phone: (336) 832-3150, Fax: (336) 832-3151. Hours of Operation:  8:30 am - 5:30 pm, M-F.  Also accepts Medicaid and self-pay.  °  [facility] [place] [address], [place] Phone: (336) 878-6027   °[facility] [address], [place], [place] (336)723-1848, Ext. 123 Mondays & Thursdays: 7-9 AM.  First 15 patients are seen on a first come, first serve basis. °  ° °Medicaid-accepting [place] Providers: ° °Organization         Address  Phone   Notes  °[facility] [address], [place] (336) 641-2100 Also accepts self-pay patients.  °[facility] [address], [place] ° (336) 856-9996   °[facility] [address], [place] (336) 288-8857   °[facility] [address] [place] [address], [place] (336) 299-7000   °Veita Bland [address], [place]  ° (336) 373-1557 Only accepts [place] Access Medicaid patients after they have their name applied to their card.  ° °Self-Pay (no insurance) in [place]: ° °Organization         Address  Phone   Notes  °Sickle Cell Patients, [facility] [address], [place] (336) 832-1970   °[facility] [address], [place] (336) 832-4400   °[facility] ° [address], [place] (336) 992-4800   °[facility]/Dr. Osei-Bonsu ° [address] [place] [address], [place] or [address], [place] (336) 841-8500 Phone number for both [place] and [place] locations is the same.  °[facility] [address], [place] (336) 299-0000   °[facility] [address] [place] [address], [place] or [address] (336) 852-7530 °(336) 878-2260   °[facility] [address], [place] (336) 350-1642, phone; (336) 294-5005, fax Sees patients 1st and 3rd Saturday of every month.  Must not qualify for public or private insurance (i.e. Medicaid, Medicare, [place] Health Choice, Veterans' Benefits) • Household income should be no more than 200% of the poverty level •The clinic cannot treat you if you are pregnant or think you are pregnant • Sexually transmitted diseases are not treated at the clinic.  ° ° °Dental Care: °Organization         Address  Phone  Notes  °[facility] [address], [place] (336) 641-6152 Accepts children up to age 21 who are enrolled in Medicaid or [place] Health Choice; pregnant women with a Medicaid card; and children who have applied for Medicaid or [place] Health Choice, but were declined, whose parents can pay a reduced fee at time of service.  °[facility] [place]  [address], [place] (336) 641-7733 Accepts children up to age 21 who are enrolled in Medicaid or [place] Health Choice; pregnant women with a Medicaid card; and children who have applied for Medicaid or [place] Health Choice, but were declined, whose parents can  pay a reduced fee at time of service.  °[facility] ° [address],  (336) 641-4533 Patients are seen by appointment only. Walk-ins are not accepted. [facility] will see patients 18 years of age and older. °Monday - Tuesday (8am-5pm) °Most Wednesdays (8:30-5pm) °$30 per visit, cash only  °[facility] ° [address], [place] (336) 641-4533 Patients are seen by appointment only. Walk-ins are not accepted. [facility] will see patients 18 years of   age and older. °One Wednesday Evening (Monthly: Volunteer Based).  $30 per visit, cash only  °[facility]  (919) 537-3737 for adults; Children under age 4, call Graduate Pediatric Dentistry at (919) [phone]. Children aged 4-14, please call (919) [phone] to request a pediatric application. ° Dental services are provided in all areas of dental care including fillings, crowns and bridges, complete and partial dentures, implants, gum treatment, root canals, and extractions. Preventive care is also provided. Treatment is provided to both adults and children. °Patients are selected via a lottery and there is often a waiting list. °  °[facility] [address], °[place] ° (336) 763-8833 [email or web address] °  °[facility] [address], [place], [place] (336)723-1848, Ext. 123 Second and Fourth Thursday of each month, opens at 6:30 AM; Clinic ends at 9 AM.  Patients are seen on a first-come first-served basis, and a limited number are seen during each clinic.  ° °[facility] ° [address], [place], [place] (336) 723-7904   Eligibility Requirements °You must have lived in [place], [place], or [place] counties for at least the last three months. °  You cannot be eligible for state or federal sponsored healthcare insurance, including Veterans Administration, Medicaid, or Medicare. °  You generally cannot be eligible for healthcare  insurance through your employer.  °  How to apply: °Eligibility screenings are held every Tuesday and Wednesday afternoon from 1:00 pm until 4:00 pm. You do not need an appointment for the interview!  °[facility] [address], [place], [place] 336-631-2330   °[facility]  336-342-8273   °[facility]  336-703-3100   °[facility]  336-570-6415   ° °Behavioral Health Resources in the Community: °Intensive Outpatient Programs °Organization         Address  Phone  Notes  °[facility] [address], [place], [place] 336-878-6098   °[facility] [address], [place], [place] 336-832-9800   °[facility] [address], [place], [place] ° 336-882-2125   °[facility] [address],  °[place], [place] 1-800-853-5163 or 336-641-4981   °Substance Abuse Resources °Organization         Address  Phone  Notes  °[facility]  336-882-2125   °[facility]  336-784-9470   °[facility]  336-285-9073   °[facility]  336-845-3988   °Residential & Outpatient Substance Abuse Program  1-800-659-3381   °Psychological Services °Organization         Address  Phone  Notes  °[facility]  336- 832-9600   °[facility]  336- 378-7881   °[facility] [address], [place] 1-800-853-5163 or 336-641-4981   ° °Mobile Crisis Teams °Organization         Address  Phone  Notes  °[facility], Mobile Crisis Care Unit  1-877-626-1772   °Assertive °[facility] ° [address] [place], [place] 336-834-9664   °Sharon DeEsch [address] °[place] 336-554-5454   ° °Self-Help/Support Groups °Organization         Address  Phone             Notes  °[facility] - variety of support groups  336- 373-1402 Call for more information  °Narcotics Anonymous (NA),  [facility] [address], °[place]   2   meetings at this location  ° °Residential Treatment Programs °Organization         Address  Phone  Notes  °[facility] [address],    °[place]  1-866-801-8205   °[facility] ° [address], [place], [place] 704-293-8524   °[facility] [address], [place] 336-845-3988 Admissions: 8am-3pm M-F  °[facility] [address]    °[place], [place] 336-841-1104   °[facility] [address], [place], [place] 336-379-7146   °[facility] [address]  °[place], [place] 336-285-9073   °[facility] - Intensive Outpatient [address], [place], [place] 336-852-3033   °[facility] [address]  °[place], [place] 1-877-615-2722 or 336-784-9470   °[facility] [address] [place], [place] 336-227-7417 Accepts Medicaid  °[facility] [address]  °[place] 1-800-659-3381 Substance Abuse/Addiction Treatment  ° °[place] Behavioral Health Resources °Organization         Address  Phone  Notes  °[facility]  (888) 581-9988   °Julie Brannon, PhD [address], [place]   (336) 349-5553 or (336) 951-0000   °[facility]   [address] °[place], [place] (336) 349-4454   °[facility] [address], [place], [place] (336) 342-8316 Insurance/Medicaid/sponsorship through [facility]  °[facility] [address]                                    [place], [place] (336) 342-8316 Therapy/tele-psych/case  °[facility] [address]  ° [place], [place] (336) 349-2233    °Dr. Arfeen  (336) 349-4544   °[facility] [place]  [employer] [place] [facility] 1) [address], [place] °2) [address], [place] °3)  [address], [place] (336) 349-3220 °(336) 342-7768 ° °(336) 342-8140    °[place] [facility] (336) 342-1394 or (336) 342-3537 (After Hours)    ° ° ° °

## 2013-12-14 NOTE — ED Notes (Signed)
Alert, NAD, calm, interactive, resting, pending re-eval.

## 2013-12-14 NOTE — ED Notes (Addendum)
CP comes and goes, low back pain 7/10 constant, "unchanged", also HA. Alert, NAD, calm, interactive. VSS/ BP elevated.

## 2013-12-14 NOTE — ED Notes (Signed)
Pt to xray

## 2013-12-14 NOTE — ED Notes (Signed)
Pt. reports mid chest pain onset today with numbness on both arms and left flank pain . Denies SOB or nausea. Pain worse with deep inspiration .

## 2013-12-14 NOTE — ED Notes (Signed)
Dr. Preston FleetingGlick notified of elevated d-dimer

## 2013-12-14 NOTE — ED Notes (Signed)
Pt intermitently falling asleep, arousable spontaneously.  when asked rates his R back pain 4/10, "a little".

## 2013-12-14 NOTE — ED Notes (Signed)
Dr. Glick at BS 

## 2013-12-14 NOTE — ED Provider Notes (Signed)
CSN: [id number]     Arrival date & time 12/13/13  2358 History   First MD Initiated Contact with Patient 12/14/13 0141     Chief Complaint  Patient presents with  . Chest Pain  . Flank Pain   (Consider location/radiation/quality/duration/timing/severity/associated sxs/prior Treatment) Patient is a 38 y.o. male presenting with chest pain and flank pain. The history is provided by the patient.  Chest Pain Flank Pain Associated symptoms include chest pain.  He has a history of hypertension and states he ran out of his medication 3 months ago. For the last 4 days, he has had pain in the left lateral chest with some radiation to the anterior chest. This pain is described as a pressure feeling it is constant and is worse when he takes a deep breath. Tonight, he developed numbness in his hands and that concerned him because he has a history of a stroke and the symptoms of her stroke were hand numbness. He states he is completely recovered from his stroke. There is mild dyspnea, nausea but no vomiting and no diaphoresis. He denies fever or chills denies cough. He tried taking some ibuprofen with no relief. He denies any recent, He admits to smoking cigarettes-half-pack a day. He denies history of diabetes, hyperlipidemia. He rates his pain at 9/10, but is 10/10 when he takes a deep breath.  Past Medical History  Diagnosis Date  . Stroke   . Hypertension   . Obesity    Past Surgical History  Procedure Laterality Date  . Appendectomy    . Cholecystectomy     Family History  Problem Relation Age of Onset  . Stroke Other    History  Substance Use Topics  . Smoking status: Current Every Day Smoker -- 1.00 packs/day for 15 years    Types: Cigarettes  . Smokeless tobacco: Not on file  . Alcohol Use: No    Review of Systems  Cardiovascular: Positive for chest pain.  Genitourinary: Positive for flank pain.  All other systems reviewed and are negative.    Allergies  Review of patient's  allergies indicates no known allergies.  Home Medications   Current Outpatient Rx  Name  Route  Sig  Dispense  Refill  . aspirin EC 81 MG tablet   Oral   Take 1 tablet (81 mg total) by mouth daily.         . hydrochlorothiazide (HYDRODIURIL) 25 MG tablet   Oral   Take 1 tablet (25 mg total) by mouth daily.   30 tablet   1   . ibuprofen (ADVIL,MOTRIN) 200 MG tablet   Oral   Take 200 mg by mouth every 6 (six) hours as needed for pain (pain).         Marland Kitchen. lisinopril (PRINIVIL,ZESTRIL) 20 MG tablet   Oral   Take 1 tablet (20 mg total) by mouth daily.   30 tablet   1    BP 177/94  Pulse 85  Temp(Src) 98.8 F (37.1 C) (Oral)  Resp 11  Ht 5\' 8"  (1.727 m)  Wt 256 lb (116.121 kg)  BMI 38.93 kg/m2  SpO2 100% Physical Exam  Nursing note and vitals reviewed.  38 year old male, resting comfortably and in no acute distress. Vital signs are significant for hypertension with blood pressure 177/94. Oxygen saturation is 100%, which is normal. Head is normocephalic and atraumatic. PERRLA, EOMI. Oropharynx is clear. Neck is nontender and supple without adenopathy or JVD. Back is nontender and there is no CVA  tenderness. Lungs are clear without rales, wheezes, or rhonchi. Chest moderately tender in the left lateral chest wall. Heart has regular rate and rhythm without murmur. Abdomen is soft, flat, nontender without masses or hepatosplenomegaly and peristalsis is normoactive. Extremities have no cyanosis or edema, full range of motion is present. Skin is warm and dry without rash. Neurologic: Mental status is normal, cranial nerves are intact, there are no motor or sensory deficits.  ED Course  Procedures (including critical care time) Labs Review Labs Reviewed  BASIC METABOLIC PANEL - Abnormal; Notable for the following:    Potassium 3.4 (*)    Glucose, Bld 143 (*)    GFR calc non Af Amer 75 (*)    GFR calc Af Amer 86 (*)    All other components within normal limits  CBC   URINALYSIS, ROUTINE W REFLEX MICROSCOPIC  POCT I-STAT TROPONIN I   Imaging Review Dg Chest 2 View  12/14/2013   CLINICAL DATA:  Chest and flank pain.  EXAM: CHEST  2 VIEW  COMPARISON:  07/20/2013.  FINDINGS: Normal heart size and mediastinal contours. No acute infiltrate or edema. No effusion or pneumothorax. No acute osseous findings. Cholecystectomy changes.  IMPRESSION: No active cardiopulmonary disease.   Electronically Signed   By: Tiburcio Pea M.D.   On: 12/14/2013 01:03    EKG Interpretation    Date/Time:  Monday December 14 2013 00:03:49 EST Ventricular Rate:  103 PR Interval:  144 QRS Duration: 88 QT Interval:  350 QTC Calculation: 458 R Axis:   54 Text Interpretation:  Sinus tachycardia Nonspecific ST and T wave abnormality Left ventricular hypertrophy with repolarization abnormality Abnormal ekg Confirmed by OPITZ  MD, BRIAN ([id number]) on 12/14/2013 12:12:01 AM          When compared with ECG of 07/20/2013, no significant changes were seen.  MDM   1. Chest pain   2. Hypertension   3. Noncompliance with medication regimen    Chest pain in a patient with known history of uncontrolled hypertension. Old records are reviewed and he was admitted to the hospital in August with hypertensive urgency. He was supposed to followup with Dr. Parke Simmers, but missed the appointment and never established care. Current pain as characteristics of pleurisy and chest wall pain. Although he has no risk factors for PE, he'll be screened with d-dimer. He will be given dose of aspirin and a therapeutic trial of ketorolac will be given.  D-dimer is come back mildly elevated and he is sent for CT angiogram.  CT angiogram shows no evidence of pulmonary embolism but does show evidence of inflammation in the posterior mediastinum at the level of T9-10. He needs to go for MRI scan to rule out discitis. This has been ordered. Case is signed out to Dr. Gwendolyn Grant to evaluate results of MRI scan.  Dione Booze,  MD 12/14/13 [phone]

## 2013-12-14 NOTE — ED Notes (Signed)
Alert, NAD, calm, interactive, no changes.  

## 2013-12-14 NOTE — ED Notes (Signed)
Back from xray, alert, NAD, calm, interactive, no changes.

## 2013-12-14 NOTE — ED Notes (Signed)
Back from CT, no changes, alert, NAD, calm, interactive.  

## 2013-12-14 NOTE — ED Notes (Signed)
Pt alert, NAD, calm, interactive, skin W&D, resps e/u, speaking in clear complete sentences. C/op HA, neck and back pain, then after being asked mentions L chest pain, 5/10. Also reports sob, and cough. Non-productive. (denies: fever, nvd)

## 2013-12-14 NOTE — ED Provider Notes (Signed)
0800 - Care from Dr. Preston FleetingGlick. 75M here with chest pain, back pain. CT angio done after positive d-dimer - no PE, but showed some edema/lymphadenopathy around T9. MRI done, nonspecific, possibly due to an occult fracture. No evidence of diskitis. Patient sleeping comfortably, easily ambulatory around the ED. Instructed to f/u with PCP.  1. Chest pain   2. Hypertension   3. Noncompliance with medication regimen      Dagmar HaitWilliam Finnbar Cedillos, MD 12/14/13 1121

## 2013-12-14 NOTE — ED Notes (Signed)
Lab contacted, d-dimer in process.

## 2014-12-19 ENCOUNTER — Emergency Department: Payer: Self-pay | Admitting: Emergency Medicine

## 2014-12-19 LAB — COMPREHENSIVE METABOLIC PANEL
ALK PHOS: 52 U/L
ANION GAP: 8 (ref 7–16)
Albumin: 3.7 g/dL (ref 3.4–5.0)
BILIRUBIN TOTAL: 0.2 mg/dL (ref 0.2–1.0)
BUN: 12 mg/dL (ref 7–18)
CALCIUM: 8.9 mg/dL (ref 8.5–10.1)
CHLORIDE: 104 mmol/L (ref 98–107)
CO2: 29 mmol/L (ref 21–32)
Creatinine: 1.36 mg/dL — ABNORMAL HIGH (ref 0.60–1.30)
Glucose: 114 mg/dL — ABNORMAL HIGH (ref 65–99)
Osmolality: 282 (ref 275–301)
Potassium: 3.6 mmol/L (ref 3.5–5.1)
SGOT(AST): 21 U/L (ref 15–37)
SGPT (ALT): 33 U/L
Sodium: 141 mmol/L (ref 136–145)
Total Protein: 7.9 g/dL (ref 6.4–8.2)

## 2014-12-19 LAB — TROPONIN I: Troponin-I: 0.07 ng/mL — ABNORMAL HIGH

## 2014-12-20 LAB — CBC WITH DIFFERENTIAL/PLATELET
BASOS PCT: 0.9 %
Basophil #: 0.1 10*3/uL (ref 0.0–0.1)
EOS PCT: 2.8 %
Eosinophil #: 0.3 10*3/uL (ref 0.0–0.7)
HCT: 43.2 % (ref 40.0–52.0)
HGB: 14 g/dL (ref 13.0–18.0)
LYMPHS ABS: 2.8 10*3/uL (ref 1.0–3.6)
Lymphocyte %: 27.2 %
MCH: 29.6 pg (ref 26.0–34.0)
MCHC: 32.3 g/dL (ref 32.0–36.0)
MCV: 92 fL (ref 80–100)
MONO ABS: 0.9 x10 3/mm (ref 0.2–1.0)
MONOS PCT: 8.3 %
NEUTROS ABS: 6.3 10*3/uL (ref 1.4–6.5)
Neutrophil %: 60.8 %
Platelet: 161 10*3/uL (ref 150–440)
RBC: 4.71 10*6/uL (ref 4.40–5.90)
RDW: 14.2 % (ref 11.5–14.5)
WBC: 10.3 10*3/uL (ref 3.8–10.6)

## 2014-12-20 LAB — URINALYSIS, COMPLETE
Bacteria: NONE SEEN
Bilirubin,UR: NEGATIVE
Blood: NEGATIVE
Glucose,UR: NEGATIVE mg/dL (ref 0–75)
KETONE: NEGATIVE
Leukocyte Esterase: NEGATIVE
Nitrite: NEGATIVE
Ph: 7 (ref 4.5–8.0)
Protein: NEGATIVE
Specific Gravity: 1.012 (ref 1.003–1.030)
Squamous Epithelial: 1
WBC UR: 1 /HPF (ref 0–5)

## 2014-12-20 LAB — TROPONIN I: TROPONIN-I: 0.07 ng/mL — AB

## 2015-08-09 ENCOUNTER — Encounter: Payer: Self-pay | Admitting: Emergency Medicine

## 2015-08-09 ENCOUNTER — Other Ambulatory Visit: Payer: Self-pay

## 2015-08-09 ENCOUNTER — Emergency Department
Admission: EM | Admit: 2015-08-09 | Discharge: 2015-08-10 | Disposition: A | Discharge status: Home / Self Care | Payer: BLUE CROSS/BLUE SHIELD | Attending: Emergency Medicine | Admitting: Emergency Medicine

## 2015-08-09 DIAGNOSIS — I10 Essential (primary) hypertension: Secondary | ICD-10-CM | POA: Insufficient documentation

## 2015-08-09 DIAGNOSIS — Z7982 Long term (current) use of aspirin: Secondary | ICD-10-CM | POA: Insufficient documentation

## 2015-08-09 DIAGNOSIS — M25512 Pain in left shoulder: Secondary | ICD-10-CM | POA: Diagnosis not present

## 2015-08-09 DIAGNOSIS — Z720 Tobacco use: Secondary | ICD-10-CM | POA: Insufficient documentation

## 2015-08-09 LAB — CBC WITH DIFFERENTIAL/PLATELET
Basophils Absolute: 0.1 10*3/uL (ref 0–0.1)
Basophils Relative: 1 %
EOS ABS: 0.4 10*3/uL (ref 0–0.7)
Eosinophils Relative: 4 %
HCT: 44.1 % (ref 40.0–52.0)
HEMOGLOBIN: 14.8 g/dL (ref 13.0–18.0)
Lymphocytes Relative: 29 %
Lymphs Abs: 2.6 10*3/uL (ref 1.0–3.6)
MCH: 30.2 pg (ref 26.0–34.0)
MCHC: 33.6 g/dL (ref 32.0–36.0)
MCV: 89.8 fL (ref 80.0–100.0)
Monocytes Absolute: 0.8 10*3/uL (ref 0.2–1.0)
NEUTROS ABS: 5.4 10*3/uL (ref 1.4–6.5)
Neutrophils Relative %: 58 %
Platelets: 168 10*3/uL (ref 150–440)
RBC: 4.91 MIL/uL (ref 4.40–5.90)
RDW: 14.7 % — ABNORMAL HIGH (ref 11.5–14.5)
WBC: 9.2 10*3/uL (ref 3.8–10.6)

## 2015-08-09 LAB — BASIC METABOLIC PANEL
Anion gap: 8 (ref 5–15)
BUN: 12 mg/dL (ref 6–20)
CO2: 27 mmol/L (ref 22–32)
Calcium: 9.2 mg/dL (ref 8.9–10.3)
Chloride: 104 mmol/L (ref 101–111)
Creatinine, Ser: 1.3 mg/dL — ABNORMAL HIGH (ref 0.61–1.24)
Glucose, Bld: 97 mg/dL (ref 65–99)
POTASSIUM: 3.4 mmol/L — AB (ref 3.5–5.1)
SODIUM: 139 mmol/L (ref 135–145)

## 2015-08-09 LAB — TROPONIN I: TROPONIN I: 0.06 ng/mL — AB (ref <0.031)

## 2015-08-09 MED ORDER — LISINOPRIL 20 MG PO TABS
10.0000 mg | ORAL_TABLET | Freq: Once | ORAL | Status: AC
  Administered 2015-08-09: 10 mg via ORAL
  Filled 2015-08-09: qty 1

## 2015-08-09 MED ORDER — HYDROCHLOROTHIAZIDE 25 MG PO TABS
25.0000 mg | ORAL_TABLET | Freq: Every day | ORAL | Status: DC
Start: 2015-08-09 — End: 2018-06-02

## 2015-08-09 MED ORDER — HYDROCHLOROTHIAZIDE 25 MG PO TABS
25.0000 mg | ORAL_TABLET | Freq: Every day | ORAL | Status: DC
  Administered 2015-08-09: 25 mg via ORAL
  Filled 2015-08-09: qty 1

## 2015-08-09 MED ORDER — LISINOPRIL 20 MG PO TABS: 20.0000 mg | ORAL_TABLET | Freq: Every day | ORAL | Status: DC

## 2015-08-09 NOTE — ED Notes (Signed)
Pt presents to ED via EMS with c/o left arm pain and HTN BP-217/130 since last Sunday. Hx of stroke. Taking lisinopril but missing hydrochlorothiazide. No neuro deficits noted at this time.

## 2015-08-09 NOTE — ED Provider Notes (Signed)
[facility] Emergency Department Provider Note  ____________________________________________  Time seen: 8:30 PM  I have reviewed the triage vital signs and the nursing notes.   HISTORY  Chief Complaint Hypertension    HPI DERICO MITTON is a 39 y.o. male who complains of left shoulder pain for the past 2 days. Is worse with movement. 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 The pain is not worsened with exertion. Not pleuritic. No fever or chills shortness of breath diaphoresis nausea vomiting back pain or abdominal pain. No trauma.  Has severe uncontrolled hypertension. Is compliant with lisinopril but ran out of HCTZ several months ago. No headaches numbness tingling weakness or vision changes.     Past Medical History  Diagnosis Date  . Stroke   . Hypertension   . Obesity      Patient Active Problem List   Diagnosis Date Noted  . Hypertensive urgency 07/20/2013  . Dental infection 07/20/2013  . Poor dentition 07/20/2013  . 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 Visual field cut 08/07/2012  . Noncompliance 08/07/2012  . Tobacco abuse 08/07/2012  . ? PRES (posterior reversible encephalopathy syndrome) 08/07/2012  . 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 ESSENTIAL HYPERTENSION 03/02/2010  . History of CVA (cerebrovascular accident) 03/02/2010  . CHOLELITHIASIS 03/02/2010     Past Surgical History  Procedure Laterality Date  . Appendectomy    . Cholecystectomy       Current Outpatient Rx  Name  Route  Sig  Dispense  Refill  . aspirin EC 81 MG tablet   Oral   Take 1 tablet (81 mg total) by mouth daily.         . hydrochlorothiazide (HYDRODIURIL) 25 MG tablet   Oral   Take 1 tablet (25 mg total) by mouth daily.   30 tablet   6   . ibuprofen (ADVIL,MOTRIN) 200 MG tablet   Oral   Take 200  mg by mouth every 6 (six) hours as needed for pain (pain).         Marland Kitchen lisinopril (PRINIVIL,ZESTRIL) 20 MG tablet   Oral   Take 1 tablet (20 mg total) by mouth daily.   30 tablet   6      Allergies Review of patient's allergies indicates no known allergies.   Family History  Problem Relation Age of Onset  . Stroke Other     Social History Social History  Substance Use Topics  . Smoking status: Current Every Day Smoker -- 1.00 packs/day for 15 years    Types: Cigarettes  . Smokeless tobacco: Never Used  . Alcohol Use: No    Review of Systems  Constitutional:   No fever or chills. No weight changes Eyes:   No blurry vision or double vision.  ENT:   No sore throat. Cardiovascular:   No chest pain. Respiratory:   No dyspnea or cough. Gastrointestinal:   Negative for abdominal pain, vomiting and diarrhea.  No BRBPR or melena. Genitourinary:   Negative for dysuria, urinary retention, bloody urine, or difficulty urinating. Musculoskeletal:   Left shoulder pain as above. Skin:   Negative for rash. Neurological:   Negative for headaches, focal weakness or numbness. Psychiatric:  No anxiety or depression.   Endocrine:  No hot/cold intolerance, changes in energy, or sleep difficulty.  10-point ROS otherwise negative.  ____________________________________________   PHYSICAL EXAM:  VITAL SIGNS: ED  Triage Vitals  Enc Vitals Group     BP 08/09/15 2036 239/131 mmHg     Pulse Rate 08/09/15 2036 91     Resp 08/09/15 2036 17     Temp 08/09/15 2036 98.5 F (36.9 C)     Temp Source 08/09/15 2036 Oral     SpO2 08/09/15 2036 99 %     Weight 08/09/15 2042 250 lb (113.399 kg)     Height 08/09/15 2042 5\' 8"  (1.727 m)     Head Cir --      Peak Flow --      Pain Score 08/09/15 2039 9     Pain Loc --      Pain Edu? --      Excl. in GC? --      Constitutional:   Alert and oriented. Well appearing and in no distress. Eyes:   No scleral icterus. No conjunctival pallor.  PERRL. EOMI ENT   Head:   Normocephalic and atraumatic.   Nose:   No congestion/rhinnorhea. No septal hematoma   Mouth/Throat:   MMM, no pharyngeal erythema. No peritonsillar mass. No uvula shift.   Neck:   No stridor. No SubQ emphysema. No meningismus. Hematological/Lymphatic/Immunilogical:   No cervical lymphadenopathy. Cardiovascular:   RRR. Normal and symmetric distal pulses are present in all extremities. No murmurs, rubs, or gallops. Respiratory:   Normal respiratory effort without tachypnea nor retractions. Breath sounds are clear and equal bilaterally. No wheezes/rales/rhonchi. Gastrointestinal:   Soft and nontender. No distention. There is no CVA tenderness.  No rebound, rigidity, or guarding. Genitourinary:   deferred Musculoskeletal:   Tenderness of the left shoulder about the heads of the deltoid. Worsened with passive abduction of the shoulder as well as palpation along the lateral deltoid. Reproduces the pain.Marland Kitchen Neurologic:   Normal speech and language.  CN 2-10 normal. Motor grossly intact. No pronator drift.  Normal gait. No gross focal neurologic deficits are appreciated.  Skin:    Skin is warm, dry and intact. No rash noted.  No petechiae, purpura, or bullae. Psychiatric:   Mood and affect are normal. Speech and behavior are normal. Patient exhibits appropriate insight and judgment.  ____________________________________________    LABS (pertinent positives/negatives) (all labs ordered are listed, but only abnormal results are displayed) Labs Reviewed  BASIC METABOLIC PANEL - Abnormal; Notable for the following:    Potassium 3.4 (*)    Creatinine, Ser 1.30 (*)    All other components within normal limits  TROPONIN I - Abnormal; Notable for the following:    Troponin I 0.06 (*)    All other components within normal limits  CBC WITH DIFFERENTIAL/PLATELET - Abnormal; Notable for the following:    RDW 14.7 (*)    All other components within normal limits   TROPONIN I   ____________________________________________   EKG  Interpreted by me Normal sinus rhythm rate of 96, normal axis and intervals. There are voltage criteria for LVH with repolarization abnormality with inverted T waves in the lateral leads.  ____________________________________________    RADIOLOGY    ____________________________________________   PROCEDURES   ____________________________________________   INITIAL IMPRESSION / ASSESSMENT AND PLAN / ED COURSE  Pertinent labs & imaging results that were available during my care of the patient were reviewed by me and considered in my medical decision making (see chart for details).  Patient presents with muscular skeletal pain of the left shoulder as well as essential hypertension that is markedly out of control. This is due to medication  noncompliance. The patient states that he has insurance and just has not made an appointment to follow-up in primary care clinic yet. We'll check labs and troponin due to the evidence of heart strain on EKG. Patient strongly advised to take medications and follow-up with primary care. ----------------------------------------- 9:41 PM on 08/09/2015 -----------------------------------------  Troponin slightly elevated at 0.06. Prior baseline negative. We'll get a 3 hour interval troponin at 11:45 PM. I wrote  Prescriptions for the patient's lisinopril and HCTZ and instruct him to follow-up in primary care clinic in a week for blood pressure recheck. At this time, and less the troponin become significantly more elevated at recheck, resume evidence for a hypertensive crisis. Patient is stable for discharge on oral medications pending the repeat troponin.  The patient is signed out to Dr. Fanny Bien at 10 PM      ____________________________________________   FINAL CLINICAL IMPRESSION(S) / ED DIAGNOSES  Final diagnoses:  Essential hypertension  Muscular skeletal left shoulder  pain    Sharman Cheek, MD 08/09/15 2142

## 2015-08-09 NOTE — Discharge Instructions (Signed)

## 2015-08-10 LAB — TROPONIN I: Troponin I: 0.06 ng/mL — ABNORMAL HIGH (ref <0.031)

## 2018-06-02 ENCOUNTER — Other Ambulatory Visit: Payer: Self-pay

## 2018-06-02 ENCOUNTER — Emergency Department
Admission: EM | Admit: 2018-06-02 | Discharge: 2018-06-02 | Disposition: A | Discharge status: Home / Self Care | Payer: Managed Care, Other (non HMO) | Attending: Emergency Medicine | Admitting: Emergency Medicine

## 2018-06-02 ENCOUNTER — Emergency Department: Payer: Managed Care, Other (non HMO)

## 2018-06-02 DIAGNOSIS — X58XXXA Exposure to other specified factors, initial encounter: Secondary | ICD-10-CM | POA: Diagnosis not present

## 2018-06-02 DIAGNOSIS — I10 Essential (primary) hypertension: Secondary | ICD-10-CM | POA: Insufficient documentation

## 2018-06-02 DIAGNOSIS — Y939 Activity, unspecified: Secondary | ICD-10-CM | POA: Diagnosis not present

## 2018-06-02 DIAGNOSIS — R600 Localized edema: Secondary | ICD-10-CM | POA: Insufficient documentation

## 2018-06-02 DIAGNOSIS — T148XXA Other injury of unspecified body region, initial encounter: Secondary | ICD-10-CM | POA: Insufficient documentation

## 2018-06-02 DIAGNOSIS — Z8673 Personal history of transient ischemic attack (TIA), and cerebral infarction without residual deficits: Secondary | ICD-10-CM | POA: Insufficient documentation

## 2018-06-02 DIAGNOSIS — M79661 Pain in right lower leg: Secondary | ICD-10-CM | POA: Diagnosis present

## 2018-06-02 DIAGNOSIS — Y999 Unspecified external cause status: Secondary | ICD-10-CM | POA: Insufficient documentation

## 2018-06-02 DIAGNOSIS — Y929 Unspecified place or not applicable: Secondary | ICD-10-CM | POA: Diagnosis not present

## 2018-06-02 DIAGNOSIS — F17210 Nicotine dependence, cigarettes, uncomplicated: Secondary | ICD-10-CM | POA: Insufficient documentation

## 2018-06-02 LAB — CBC WITH DIFFERENTIAL/PLATELET
Basophils Absolute: 0.1 10*3/uL (ref 0–0.1)
Basophils Relative: 1 %
EOS ABS: 0.3 10*3/uL (ref 0–0.7)
Eosinophils Relative: 3 %
HEMATOCRIT: 39 % — AB (ref 40.0–52.0)
Hemoglobin: 13.2 g/dL (ref 13.0–18.0)
Lymphocytes Relative: 24 %
Lymphs Abs: 2.3 10*3/uL (ref 1.0–3.6)
MCH: 30.4 pg (ref 26.0–34.0)
MCHC: 33.9 g/dL (ref 32.0–36.0)
MCV: 89.6 fL (ref 80.0–100.0)
MONO ABS: 0.6 10*3/uL (ref 0.2–1.0)
Monocytes Relative: 6 %
Neutro Abs: 6.3 10*3/uL (ref 1.4–6.5)
Neutrophils Relative %: 66 %
Platelets: 179 10*3/uL (ref 150–440)
RBC: 4.35 MIL/uL — ABNORMAL LOW (ref 4.40–5.90)
RDW: 14.2 % (ref 11.5–14.5)
WBC: 9.6 10*3/uL (ref 3.8–10.6)

## 2018-06-02 LAB — COMPREHENSIVE METABOLIC PANEL
ALT: 27 U/L (ref 0–44)
ANION GAP: 8 (ref 5–15)
AST: 25 U/L (ref 15–41)
Albumin: 3.9 g/dL (ref 3.5–5.0)
Alkaline Phosphatase: 48 U/L (ref 38–126)
BUN: 14 mg/dL (ref 6–20)
CO2: 27 mmol/L (ref 22–32)
Calcium: 8.7 mg/dL — ABNORMAL LOW (ref 8.9–10.3)
Chloride: 103 mmol/L (ref 98–111)
Creatinine, Ser: 1.29 mg/dL — ABNORMAL HIGH (ref 0.61–1.24)
GFR calc Af Amer: >60 mL/min (ref >60)
GFR calc non Af Amer: >60 mL/min (ref >60)
GLUCOSE: 138 mg/dL — AB (ref 70–99)
POTASSIUM: 3.2 mmol/L — AB (ref 3.5–5.1)
Sodium: 138 mmol/L (ref 135–145)
Total Bilirubin: 0.5 mg/dL (ref 0.3–1.2)
Total Protein: 7.6 g/dL (ref 6.5–8.1)

## 2018-06-02 MED ORDER — ASPIRIN EC 81 MG PO TBEC: 81.0000 mg | DELAYED_RELEASE_TABLET | Freq: Every day | ORAL | 3 refills | Status: AC

## 2018-06-02 MED ORDER — LISINOPRIL 20 MG PO TABS: 20.0000 mg | ORAL_TABLET | Freq: Every day | ORAL | 6 refills | Status: AC

## 2018-06-02 MED ORDER — IPRATROPIUM-ALBUTEROL 0.5-2.5 (3) MG/3ML IN SOLN
RESPIRATORY_TRACT | Status: AC
  Filled 2018-06-02: qty 3

## 2018-06-02 MED ORDER — IOHEXOL 300 MG/ML  SOLN
100.0000 mL | Freq: Once | INTRAMUSCULAR | Status: AC | PRN
  Administered 2018-06-02: 100 mL via INTRAVENOUS

## 2018-06-02 MED ORDER — OXYCODONE-ACETAMINOPHEN 5-325 MG PO TABS: 1.0000 | ORAL_TABLET | Freq: Three times a day (TID) | ORAL | 0 refills | Status: AC | PRN

## 2018-06-02 MED ORDER — HYDROCHLOROTHIAZIDE 25 MG PO TABS: 25.0000 mg | ORAL_TABLET | Freq: Every day | ORAL | 6 refills | Status: AC

## 2018-06-02 MED ORDER — OXYCODONE-ACETAMINOPHEN 5-325 MG PO TABS
2.0000 | ORAL_TABLET | Freq: Once | ORAL | Status: AC
  Administered 2018-06-02: 2 via ORAL
  Filled 2018-06-02: qty 2

## 2018-06-02 NOTE — ED Notes (Signed)
Wheeled back to room. Transferred from wheelchair to stretcher independently without difficulty. Clinton SawyerKailey, Rn aware of patient placement in stretcher.

## 2018-06-02 NOTE — ED Notes (Signed)
Spoke with pt about wait times and what to expect next. Advised pt that I am available for further questions if needed.  

## 2018-06-02 NOTE — ED Notes (Signed)
Discussed pt with Dr Cyril LoosenKinner - VO given for cbc w/ diff, cmp, and venous ultrasound of right leg

## 2018-06-02 NOTE — ED Provider Notes (Addendum)
[facility] Emergency Department Provider Note       Time seen: ----------------------------------------- 1:21 PM on 06/02/2018 -----------------------------------------   I have reviewed the triage vital signs and the nursing notes.  HISTORY   Chief Complaint Leg Pain    HPI [name] is a 42 y.o. male with a history of hypertension, obesity and CVA who presents to the ED for right lower leg pain that started last Sunday.  Patient reports the area is burning and painful to walk on.  Patient is never had a problem with this before, he is doing more activity than normal on his job.  He denies fevers, chills or other complaints.  Past Medical History:  Diagnosis Date  . Hypertension   . Obesity   . Stroke [facility](HCC)     Patient Active Problem List   Diagnosis Date Noted  . Hypertensive urgency 07/20/2013  . Dental infection 07/20/2013  . Poor dentition 07/20/2013  . 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 Visual field cut 08/07/2012  . Noncompliance 08/07/2012  . Tobacco abuse 08/07/2012  . ? PRES (posterior reversible encephalopathy syndrome) 08/07/2012  . 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 ESSENTIAL HYPERTENSION 03/02/2010  . History of CVA (cerebrovascular accident) 03/02/2010  . CHOLELITHIASIS 03/02/2010    Past Surgical History:  Procedure Laterality Date  . APPENDECTOMY    . CHOLECYSTECTOMY      Allergies Patient has no known allergies.  Social History Social History   Tobacco Use  . Smoking status: Current Every Day Smoker    Packs/day: 1.00    Years: 15.00    Pack years: 15.00    Types: Cigarettes  . Smokeless tobacco: Never Used  Substance Use Topics  . Alcohol use: No  . Drug use: No   Review of Systems Constitutional: Negative for fever. Cardiovascular: Negative for chest pain. Respiratory: Negative for shortness of breath. Gastrointestinal: Negative  for abdominal pain, vomiting and diarrhea. Musculoskeletal: Positive for right leg pain Skin: Negative for rash or bruising Neurological: Negative for headaches, focal weakness or numbness.  All systems negative/normal/unremarkable except as stated in the HPI  ____________________________________________   PHYSICAL EXAM:  VITAL SIGNS: ED Triage Vitals  Enc Vitals Group     BP 06/02/18 1127 (!) 229/112     Pulse Rate 06/02/18 1127 100     Resp 06/02/18 1127 15     Temp 06/02/18 1127 98.3 F (36.8 C)     Temp Source 06/02/18 1127 Oral     SpO2 06/02/18 1127 98 %     Weight 06/02/18 1126 254 lb (115.2 kg)     Height 06/02/18 1126 5\' 7"  (1.702 m)     Head Circumference --      Peak Flow --      Pain Score 06/02/18 1126 9     Pain Loc --      Pain Edu? --      Excl. in GC? --    Constitutional: Alert and oriented. Well appearing and in no distress. Eyes: Conjunctivae are normal. Normal extraocular movements. Cardiovascular: Normal rate, regular rhythm. No murmurs, rubs, or gallops. Respiratory: Normal respiratory effort without tachypnea nor retractions. Breath sounds are clear and equal bilaterally. No wheezes/rales/rhonchi. Gastrointestinal: Soft and nontender. Normal bowel sounds Musculoskeletal: Significant tenderness and diffuse swelling of the right gastrocnemius muscle area.  There is a soft tissue mass evident on the spot of uncertain etiology. Neurologic:  Normal speech and language. No gross  focal neurologic deficits are appreciated.  Normal sensory motor function in the right lower extremity Skin:  Skin is warm, dry and intact. No rash noted.  No ecchymosis or edema ____________________________________________  ED COURSE:  As part of my medical decision making, I reviewed the following data within the electronic MEDICAL RECORD NUMBER History obtained from family if available, nursing notes, old chart and ekg, as well as notes from prior ED visits. Patient presented for  right lower extremity swelling and pain, we will assess with labs and imaging as indicated at this time.   Procedures ____________________________________________   LABS (pertinent positives/negatives)  Labs Reviewed  CBC WITH DIFFERENTIAL/PLATELET - Abnormal; Notable for the following components:      Result Value   RBC 4.35 (*)    HCT 39.0 (*)    All other components within normal limits  COMPREHENSIVE METABOLIC PANEL - Abnormal; Notable for the following components:   Potassium 3.2 (*)    Glucose, Bld 138 (*)    Creatinine, Ser 1.29 (*)    Calcium 8.7 (*)    All other components within normal limits    RADIOLOGY Right lower extremity ultrasound  IMPRESSION: No evidence of deep venous thrombosis in the RIGHT lower extremity.  12.0 x 3.2 x 6.0 cm diameter lenticular subcutaneous collection at the RIGHT calf, demonstrating no internal blood flow, question hematoma versus hypovascular soft tissue mass; recommend correlation with patient history and clinical follow-up until resolution.  If this fails to resolve, further imaging by MR with and without contrast would be recommended. CT RLE  IMPRESSION: 1. Nonspecific subcutaneous edema posteriorly in the lower leg; consider cellulitis. No focal fluid collection. 2. No suspected acute fracture or dislocation. Tiny ossific density adjacent to the medial malleolus is not suspected to be acute. Correlate with area of pain.  ____________________________________________  DIFFERENTIAL DIAGNOSIS   DVT, Baker's cyst, muscle strain, hematoma, abscess  FINAL ASSESSMENT AND PLAN  Right lower extremity swelling, prescription refill for hypertension   Plan: The patient had presented for right lower extremity pain and swelling. Patient's labs were unremarkable for him. Patient's imaging did reveal a fluid collection that was 12 x 6 x 3.2 cm.  On CT has nonspecific edema but no focal fluid collection which is reassuring.  He  will be discharged with pain medicine, encouraged to use compression stockings.  He was also noted to be hypertensive here, states he has been off his blood pressure medicines for 9 months.  I have rewritten his blood pressure medications.  He is cleared for outpatient follow-up in 2 days for recheck.   Ulice Dash, MD   Note: This note was generated in part or whole with voice recognition software. Voice recognition is usually quite accurate but there are transcription errors that can and very often do occur. I apologize for any typographical errors that were not detected and corrected.     Emily Filbert, MD 06/02/18 1528    Emily Filbert, MD 06/02/18 1536

## 2018-06-02 NOTE — ED Notes (Signed)
Pt back from CT

## 2018-06-02 NOTE — ED Triage Notes (Signed)
Pt c/o right lower leg pain that started last Sunday - he reports the area is burning and is painful to walk

## 2018-06-02 NOTE — ED Notes (Signed)
Patient transported to CT 

## 2018-09-14 ENCOUNTER — Emergency Department
Admission: EM | Admit: 2018-09-14 | Discharge: 2018-09-14 | Disposition: A | Discharge status: Home / Self Care | Payer: Managed Care, Other (non HMO) | Attending: Emergency Medicine | Admitting: Emergency Medicine

## 2018-09-14 ENCOUNTER — Emergency Department: Payer: Managed Care, Other (non HMO)

## 2018-09-14 ENCOUNTER — Other Ambulatory Visit: Payer: Self-pay

## 2018-09-14 DIAGNOSIS — F17210 Nicotine dependence, cigarettes, uncomplicated: Secondary | ICD-10-CM | POA: Diagnosis not present

## 2018-09-14 DIAGNOSIS — Z7982 Long term (current) use of aspirin: Secondary | ICD-10-CM | POA: Insufficient documentation

## 2018-09-14 DIAGNOSIS — Z8673 Personal history of transient ischemic attack (TIA), and cerebral infarction without residual deficits: Secondary | ICD-10-CM | POA: Diagnosis not present

## 2018-09-14 DIAGNOSIS — I10 Essential (primary) hypertension: Secondary | ICD-10-CM | POA: Insufficient documentation

## 2018-09-14 DIAGNOSIS — R2242 Localized swelling, mass and lump, left lower limb: Secondary | ICD-10-CM | POA: Diagnosis not present

## 2018-09-14 DIAGNOSIS — Z79899 Other long term (current) drug therapy: Secondary | ICD-10-CM | POA: Diagnosis not present

## 2018-09-14 DIAGNOSIS — M25472 Effusion, left ankle: Secondary | ICD-10-CM | POA: Diagnosis not present

## 2018-09-14 DIAGNOSIS — M25572 Pain in left ankle and joints of left foot: Secondary | ICD-10-CM

## 2018-09-14 MED ORDER — DICLOFENAC SODIUM 1 % TD GEL: 4.0000 g | Freq: Four times a day (QID) | TRANSDERMAL | 1 refills | Status: AC

## 2018-09-14 MED ORDER — TRAMADOL HCL 50 MG PO TABS: 50.0000 mg | ORAL_TABLET | Freq: Four times a day (QID) | ORAL | 0 refills | Status: AC | PRN

## 2018-09-14 NOTE — ED Triage Notes (Signed)
Pt c/o left foot pain with swelling since yesterday. 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

## 2018-09-14 NOTE — ED Provider Notes (Signed)
[facility] Emergency Department Provider Note ____________________________________________  Time seen: Approximately 5:30 PM  I have reviewed the triage vital signs and the nursing notes.   HISTORY  Chief Complaint Foot Pain    HPI [name] is a 42 y.o. male who presents to the emergency department for evaluation and treatment of foot pain and swelling since yesterday.  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  Patient states that he noticed the pain yesterday and it has gradually increased over the past 24 hours.  He has a significant past medical history of CVA which she states was approximately 20 years ago.  Today, he denies calf pain or swelling.  He denies chest pain, shortness of breath, or pain with deep inspiration.  He is currently taking hydrochlorothiazide and aspirin. For the ankle and foot pain, he has taken ibuprofen and used a muscle rub cream.   Past Medical History:  Diagnosis Date  . Hypertension   . Obesity   . Stroke [facility])     Patient Active Problem List   Diagnosis Date Noted  . Hypertensive urgency 07/20/2013  . Dental infection 07/20/2013  . Poor dentition 07/20/2013  . 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 Visual field cut 08/07/2012  . Noncompliance 08/07/2012  . Tobacco abuse 08/07/2012  . ? PRES (posterior reversible encephalopathy syndrome) 08/07/2012  . 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 ESSENTIAL HYPERTENSION 03/02/2010  . History of CVA (cerebrovascular accident) 03/02/2010  . CHOLELITHIASIS 03/02/2010    Past Surgical History:  Procedure Laterality Date  . APPENDECTOMY    . CHOLECYSTECTOMY      Prior to Admission medications   Medication Sig Start Date End Date Taking? Authorizing Provider  aspirin EC 81 MG tablet Take 1 tablet (81 mg total) by mouth daily. 06/02/18   Emily Filbert, MD   diclofenac sodium (VOLTAREN) 1 % GEL Apply 4 g topically 4 (four) times daily. 09/14/18   Hulen Mandler B, FNP  hydrochlorothiazide (HYDRODIURIL) 25 MG tablet Take 1 tablet (25 mg total) by mouth daily. 06/02/18   Emily Filbert, MD  ibuprofen (ADVIL,MOTRIN) 200 MG tablet Take 200 mg by mouth every 6 (six) hours as needed for pain (pain).    [provider]  lisinopril (PRINIVIL,ZESTRIL) 20 MG tablet Take 1 tablet (20 mg total) by mouth daily. 06/02/18   Emily Filbert, MD  oxyCODONE-acetaminophen (PERCOCET) 5-325 MG tablet Take 1-2 tablets by mouth every 8 (eight) hours as needed. 06/02/18   Emily Filbert, MD  traMADol (ULTRAM) 50 MG tablet Take 1 tablet (50 mg total) by mouth every 6 (six) hours as needed. 09/14/18   Chinita Pester, FNP    Allergies Patient has no known allergies.  Family History  Problem Relation Age of Onset  . Stroke Other     Social History Social History   Tobacco Use  . Smoking status: Current Every Day Smoker    Packs/day: 1.00    Years: 15.00    Pack years: 15.00    Types: Cigarettes  . Smokeless tobacco: Never Used  Substance Use Topics  . Alcohol use: No  . Drug use: No    Review of Systems Constitutional: Negative for fever. Cardiovascular: Negative for chest pain. Respiratory: Negative for shortness of breath. Musculoskeletal: Positive for left foot and ankle pain. Skin: Negative for recent wound or known infection.  Neurological: Negative for  decrease in sensation  ____________________________________________   PHYSICAL EXAM:  VITAL SIGNS: ED Triage Vitals  Enc Vitals Group     BP 09/14/18 1653 (!) 192/88     Pulse Rate 09/14/18 1653 92     Resp 09/14/18 1653 16     Temp 09/14/18 1653 98.2 F (36.8 C)     Temp Source 09/14/18 1653 Oral     SpO2 09/14/18 1653 96 %     Weight 09/14/18 1655 250 lb (113.4 kg)     Height 09/14/18 1655 5\' 7"  (1.702 m)     Head Circumference --      Peak Flow --      Pain  Score 09/14/18 1703 10     Pain Loc --      Pain Edu? --      Excl. in GC? --     Constitutional: Alert and oriented. Well appearing and in no acute distress. Eyes: Conjunctivae are clear without discharge or drainage Head: Atraumatic Neck: Supple. Respiratory: No cough. Respirations are even and unlabored. Musculoskeletal: ATFL pattern swelling over the left foot and ankle. Negative Homan's sign. Calf is soft. No expressed pain with flexion of the knee or with internal or external rotation of the left hip.  Neurologic: Awake, alert, and oriented. Motor and sensory function of the left lower extremity is intact.  Skin: No open wounds or lesion over exposed skin surface.  Psychiatric: Affect and behavior are appropriate.  ____________________________________________   LABS (all labs ordered are listed, but only abnormal results are displayed)  Labs Reviewed - No data to display ____________________________________________  RADIOLOGY  Image of the left foot and ankle do not show any acute bony abnormality, there is suggestion active joint effusion in the ankle. ____________________________________________   PROCEDURES  .Splint Application Date/Time: 09/14/2018 7:23 PM Performed by: Adria Dill, NT Authorized by: Chinita Pester, FNP   Consent:    Consent obtained:  Verbal   Consent given by:  Patient   Risks discussed:  Numbness and pain Pre-procedure details:    Sensation:  Normal Procedure details:    Laterality:  Left   Location:  Ankle   Splint type:  Ankle stirrup   Supplies:  Prefabricated splint Post-procedure details:    Pain:  Unchanged   Sensation:  Normal   Patient tolerance of procedure:  Tolerated well, no immediate complications   ____________________________________________   INITIAL IMPRESSION / ASSESSMENT AND PLAN / ED COURSE  [name] is a 42 y.o. who presents to the emergency department for evaluation and treatment of left ankle  and foot pain.  No specific injury.  Patient does have a significant past medical history of CVA and is on aspirin.  He does not have any history of DVT or PE.  Today, he has no calf pain or swelling.  He also denies any pleuritic chest pain and is able to take a full deep breaths without pain.  Also, he is noted to be hypertensive but states that he has not taken his medication today and feels that it is more elevated due to pain.  He was instructed to take his medication tonight as usual and recheck his blood pressure tomorrow. He is to return to the ER for any symptom of concern.  Patient instructed to follow-up with podiatry if not improving over the week.  He was also instructed to return to the emergency department for symptoms that change or worsen if unable schedule an appointment with orthopedics or  primary care.  Medications - No data to display  Pertinent labs & imaging results that were available during my care of the patient were reviewed by me and considered in my medical decision making (see chart for details).  _________________________________________   FINAL CLINICAL IMPRESSION(S) / ED DIAGNOSES  Final diagnoses:  Ankle joint effusion, left  Acute left ankle pain    ED Discharge Orders         Ordered    traMADol (ULTRAM) 50 MG tablet  Every 6 hours PRN     09/14/18 1921    diclofenac sodium (VOLTAREN) 1 % GEL  4 times daily     09/14/18 1921           If controlled substance prescribed during this visit, 12 month history viewed on the [facility] prior to issuing an initial prescription for Schedule II or III opiod.    Chinita Pester, FNP 09/14/18 2008    Jene Every, MD 09/14/18 2027

## 2018-09-14 NOTE — Discharge Instructions (Addendum)
Please follow-up with podiatry if pain and swelling continues for more than a week.  Ice the foot and ankle 20 minutes/h while awake for the next couple of days.  Keep the foot elevated as often as possible.  Wear the brace anytime you are out of bed.  In addition to the tramadol and voltaren, you may take tylenol to help with pain.  Return to the ER for symptoms that change or worsen or for new concerns.

## 2020-01-04 DEATH — deceased

## 2020-06-24 IMAGING — DX DG ANKLE COMPLETE 3+V*L*
3 series · 3 of 3 positions shown · non-contrast
Comparison: None.

CLINICAL DATA: Pain and swelling.  No known trauma.

EXAM:
LEFT ANKLE COMPLETE - 3+ VIEW

[ankle ap]
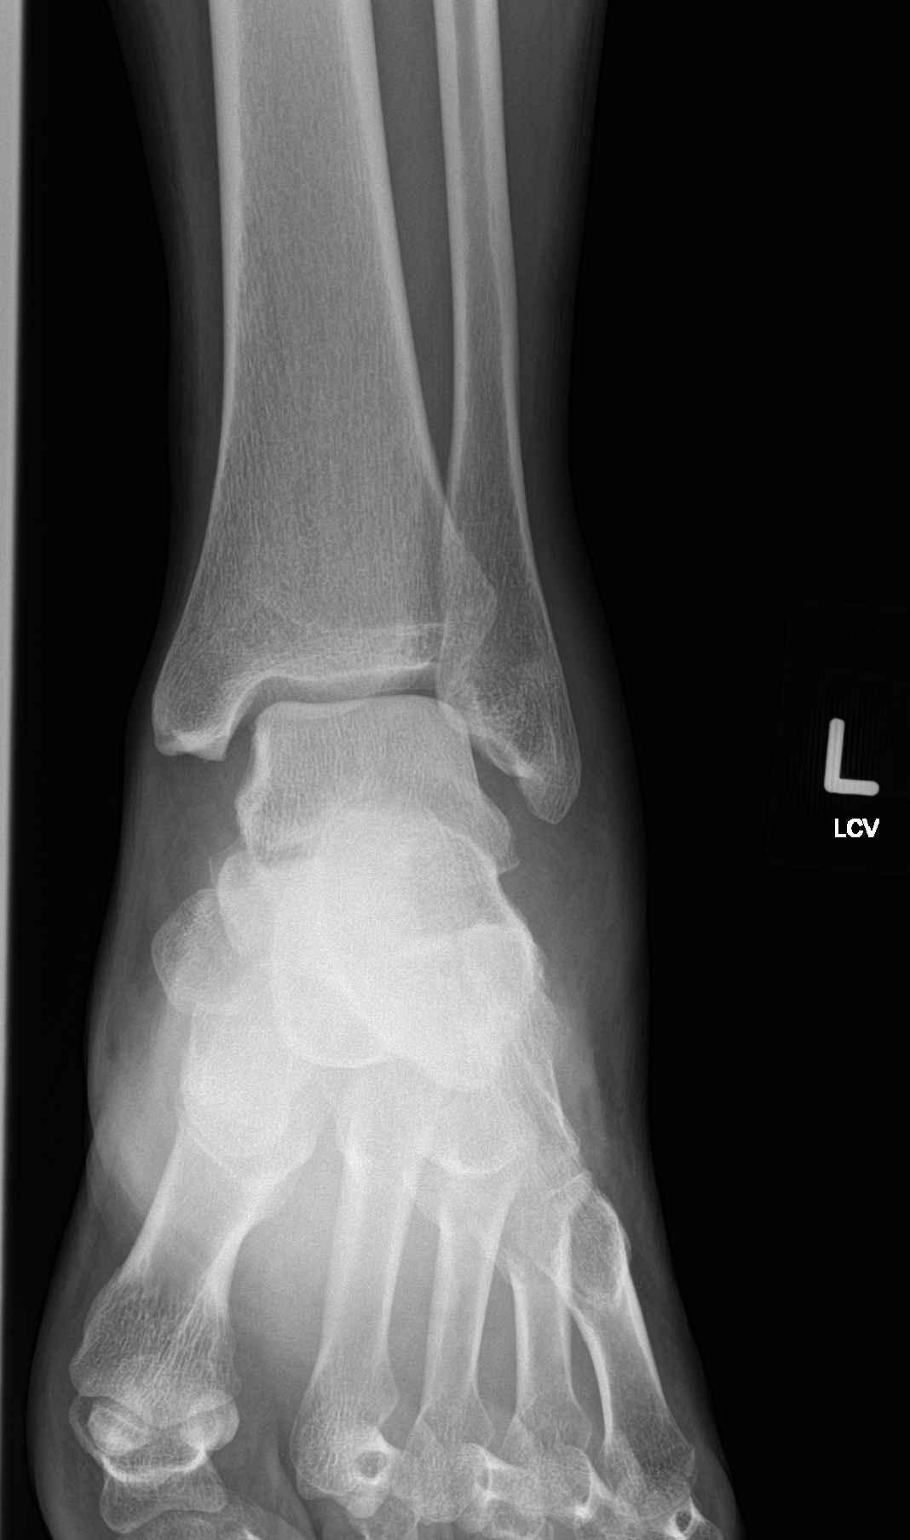

[ankle obl]
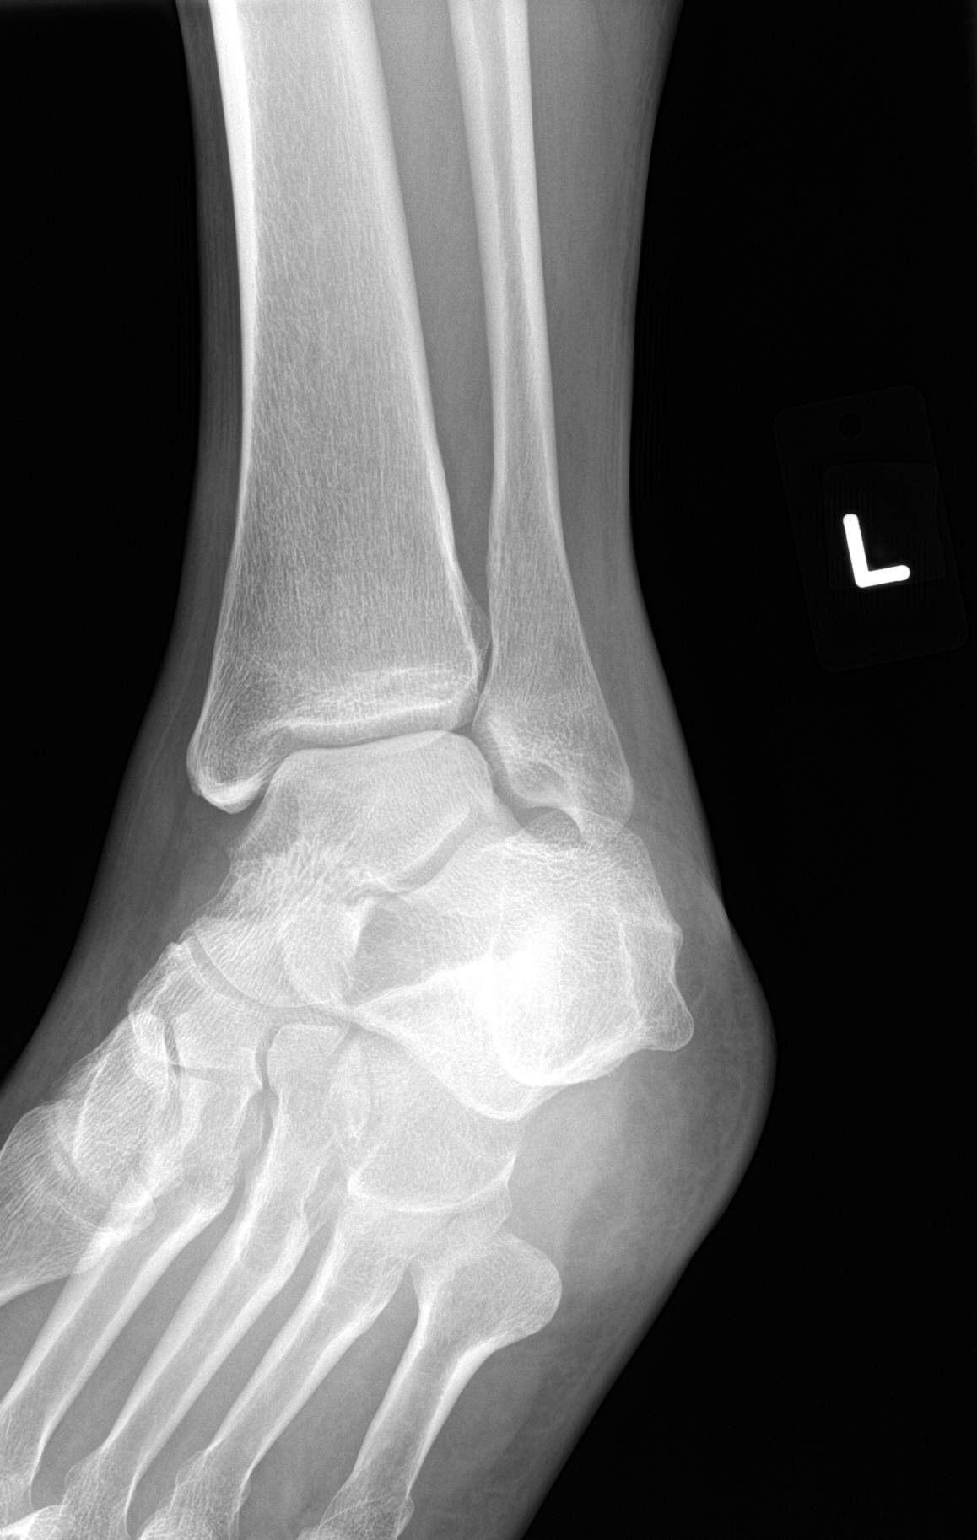

[ankle lat]
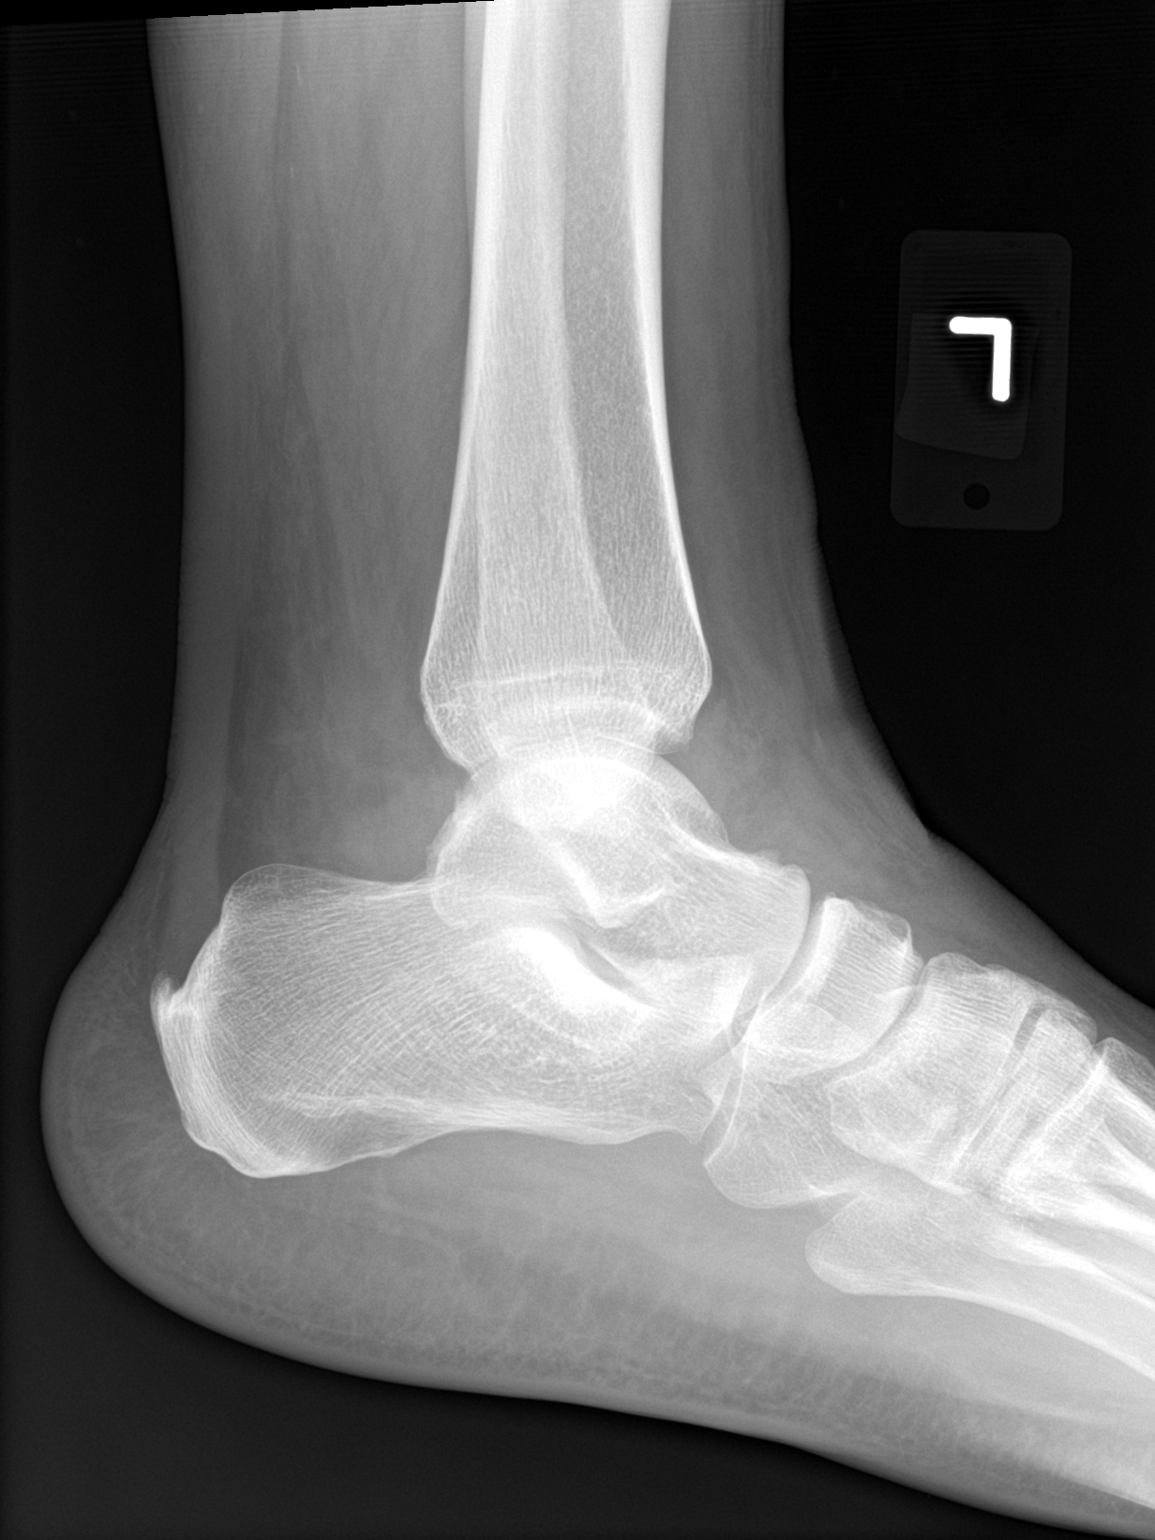

[3 of 3 positions shown; findings below may reference images not displayed]

FINDINGS: There is no evidence of fracture or dislocation. Suggestion of an
ankle effusion. There is no evidence of arthropathy or other focal
bone abnormality. Soft tissues are unremarkable.
IMPRESSION: No bone abnormality.  Probable ankle effusion.
